# Patient Record
Sex: Female | Born: 1985 | Hispanic: Yes | Smoking: Never smoker
Health system: Southern US, Community
[De-identification: ages and names within clinical notes are randomized; demographics above are authoritative.]

## PROBLEM LIST (undated history)

## (undated) DIAGNOSIS — Z789 Other specified health status: Secondary | ICD-10-CM

## (undated) HISTORY — PX: NO PAST SURGERIES: SHX2092

---

## 2021-09-01 ENCOUNTER — Emergency Department (HOSPITAL_COMMUNITY)
Admission: EM | Admit: 2021-09-01 | Discharge: 2021-09-01 | Disposition: A | Discharge status: Home / Self Care | Payer: Self-pay | Attending: Emergency Medicine | Admitting: Emergency Medicine

## 2021-09-01 ENCOUNTER — Emergency Department (HOSPITAL_COMMUNITY): Payer: Self-pay

## 2021-09-01 ENCOUNTER — Encounter (HOSPITAL_COMMUNITY): Payer: Self-pay | Admitting: Emergency Medicine

## 2021-09-01 DIAGNOSIS — M25572 Pain in left ankle and joints of left foot: Secondary | ICD-10-CM | POA: Insufficient documentation

## 2021-09-01 DIAGNOSIS — X501XXA Overexertion from prolonged static or awkward postures, initial encounter: Secondary | ICD-10-CM | POA: Insufficient documentation

## 2021-09-01 DIAGNOSIS — S93402A Sprain of unspecified ligament of left ankle, initial encounter: Secondary | ICD-10-CM | POA: Insufficient documentation

## 2021-09-01 NOTE — ED Provider Notes (Signed)
[facility] EMERGENCY DEPARTMENT Provider Note   CSN: [id number] Arrival date & time: 09/01/21  0814     History  Chief Complaint  Patient presents with   Ankle Injury    Pain/Swelling    Dawn Little is a 36 y.o. female.  HPI Patient is a 36 year old female with no pertinent past medical history presented emergency room today after she states that she twisted her ankle at approximately 6 PM while wearing high heels.  She denies any head injuries or other injuries from the fall.  States that she mostly just sat down heavily.     Home Medications Prior to Admission medications   Not on File      Allergies    Patient has no known allergies.    Review of Systems   Review of Systems  Physical Exam Updated Vital Signs BP 105/73 (BP Location: Right Arm)    Pulse 79    Temp (!) 97.3 F (36.3 C) (Oral)    Resp 16    LMP 08/02/2021    SpO2 99%  Physical Exam Vitals and nursing note reviewed.  Constitutional:      General: She is not in acute distress.    Appearance: Normal appearance. She is not ill-appearing.  HENT:     Head: Normocephalic and atraumatic.     Mouth/Throat:     Mouth: Mucous membranes are moist.  Eyes:     General: No scleral icterus.       Right eye: No discharge.        Left eye: No discharge.     Conjunctiva/sclera: Conjunctivae normal.  Pulmonary:     Effort: Pulmonary effort is normal.     Breath sounds: No stridor.  Musculoskeletal:     Comments: Left ankle with lateral malleolar tenderness to palpation.  There is scant swelling or no bruising.  Fifth metatarsal without bony tenderness.  Able to wiggle toes.  Cap refill less than 3 seconds in all toes.  DP pulse 3+  Skin:    General: Skin is warm and dry.  Neurological:     Mental Status: She is alert and oriented to person, place, and time. Mental status is at baseline.    ED Results / Procedures / Treatments   Labs (all labs ordered are listed, but  only abnormal results are displayed) Labs Reviewed - No data to display  EKG None  Radiology DG Ankle Complete Left  Result Date: 09/01/2021 CLINICAL DATA:  Trauma to the left ankle. EXAM: LEFT ANKLE COMPLETE - 3+ VIEW COMPARISON:  None. FINDINGS: Apparent bone fragment along the lateral foot may represent cortical avulsion injury. This is not well evaluated on this radiograph. Dedicated radiograph of the foot is recommended for better evaluation. No other acute fracture identified. No dislocation. The bones are well mineralized. No arthritic changes. The soft tissue swelling of the lateral ankle. IMPRESSION: 1. Possible cortical avulsion fracture of the lateral foot. Dedicated radiograph of the foot is recommended for better evaluation. 2. Soft tissue swelling of the lateral ankle. Electronically Signed   By: Elgie Collard M.D.   On: 09/01/2021 03:16   DG Foot Complete Left  Result Date: 09/01/2021 CLINICAL DATA:  Ankle sprain. EXAM: LEFT FOOT - COMPLETE 3+ VIEW COMPARISON:  Ankle radiograph dated 09/01/2021. FINDINGS: There is no evidence of fracture or dislocation. There is no evidence of arthropathy or other focal bone abnormality. Soft tissues are unremarkable. IMPRESSION: Negative. Electronically Signed   By: Burtis Junes  Radparvar M.D.   On: 09/01/2021 03:58    Procedures Procedures    Medications Ordered in ED Medications - No data to display  ED Course/ Medical Decision Making/ A&P                           Medical Decision Making Amount and/or Complexity of Data Reviewed Radiology: ordered.   Patient is a 36 year old Spanish-speaking female Spanish language interpreter was used for the entirety of visit.  Patient chest x-ray ankle approximately 6 PM this evening does have lateral malleolus tenderness and swelling.  X-ray obtained was negative for fracture of the malleolus side however there is concern for foot fracture on x-ray.  X-ray obtained which is negative.  I  independently reviewed all x-rays.  Patient placed in ankle brace and recommended Tylenol ibuprofen and orthopedic follow-up.  She is distally neurovascularly intact.  No other injuries   Physical exam reassuring  Instructions provided to patient in Spanish.  She understands recommendations and follow-up instructions.  Final Clinical Impression(s) / ED Diagnoses Final diagnoses:  Sprain of left ankle, unspecified ligament, initial encounter    Rx / DC Orders ED Discharge Orders     None         Gailen Shelter, [place] 09/01/21 0422    Dione Booze, MD 09/01/21 [phone]

## 2021-09-01 NOTE — ED Triage Notes (Signed)
Patient twisted her left ankle while wearing high heels shoes last night , presents with left ankle pain/swelling .

## 2021-09-01 NOTE — Discharge Instructions (Addendum)
Your x-ray does not show any evidence of a fracture.  Please wear the ankle brace provided to you.  Use crutches as needed.  Ice and elevate your ankle.  I have given you the information for an orthopedic office to follow-up with.  Please read the attached information about ankle sprains they may take weeks to heal.  Tylenol and ibuprofen can be very helpful please take it as directed below.  Please use Tylenol or ibuprofen for pain.  You may use 600 mg ibuprofen every 6 hours or 1000 mg of Tylenol every 6 hours.  You may choose to alternate between the 2.  This would be most effective.  Not to exceed 4 g of Tylenol within 24 hours.  Not to exceed 3200 mg ibuprofen 24 hours.       Su radiografa no muestra ninguna evidencia de fractura. Por favor, use la tobillera que se le proporcion. Use muletas segn sea necesario. Ponga hielo y eleve su tobillo. Le he dado la informacin de un consultorio ortopdico para Insurance risk surveyor. Lea la informacin adjunta sobre los esguinces de tobillo que pueden tardar Scientific laboratory technician. Tylenol e ibuprofeno pueden ser muy tiles, tmelos como se indica a continuacin.  Utilice Tylenol o ibuprofeno para el dolor. Puede usar 600 mg de ibuprofeno cada 6 horas o 1000 mg de Tylenol cada 6 horas. Puede optar por [employer] 2. Esto sera ms efectivo. No exceder los 4 g de Tylenol en 24 horas. No exceder los 3200 mg de ibuprofeno 24 horas.

## 2021-10-19 ENCOUNTER — Ambulatory Visit (INDEPENDENT_AMBULATORY_CARE_PROVIDER_SITE_OTHER): Payer: Self-pay | Admitting: Obstetrics and Gynecology

## 2021-10-19 ENCOUNTER — Other Ambulatory Visit: Payer: Self-pay

## 2021-10-19 VITALS — BP 112/71 | HR 74 | Wt 153.0 lb

## 2021-10-19 DIAGNOSIS — Z3200 Encounter for pregnancy test, result unknown: Secondary | ICD-10-CM

## 2021-10-19 DIAGNOSIS — Z3491 Encounter for supervision of normal pregnancy, unspecified, first trimester: Secondary | ICD-10-CM

## 2021-10-19 DIAGNOSIS — O219 Vomiting of pregnancy, unspecified: Secondary | ICD-10-CM

## 2021-10-19 LAB — POCT PREGNANCY, URINE: Preg Test, Ur: POSITIVE — AB

## 2021-10-19 MED ORDER — PROMETHAZINE HCL 25 MG PO TABS: 25.0000 mg | ORAL_TABLET | Freq: Four times a day (QID) | ORAL | 0 refills | Status: DC | PRN

## 2021-10-19 NOTE — Progress Notes (Signed)
Possible Pregnancy ? ?Here today for pregnancy confirmation. UPT in office today is positive. Pt reports first positive home UPT on 10/12/21. Began feeling symptoms of pregnancy around 09/21/21. Reviewed dating with patient:  ? ?LMP: 09/03/21 ?EDD: 06/10/22 ?6w 4d today ? ?Patient reports a regular period every month that usually lasts 4-6 days. Patient states LMP was different that usual period. Describes as lighter and only lasting 2 days. OB history reviewed; no prior pregnancies. Reviewed medications and allergies with patient; list of medications safe to take during pregnancy given. Recommended pt begin prenatal vitamin. ? ?Patient not seen in our office prior. Vergie Living, MD to bedside for brief visit. [place] recommends dating [place] to confirm dates due to abnormal last period. Patient reports headaches and nausea. Verbal order given for Phenergan. Pt will follow up to schedule new pt appts after [place] on 11/02/21. ? ?Entire encounter completed with [facility] interpreter Raquel. ? ?Marjo Bicker, RN ?10/19/2021  9:19 AM ? ?

## 2021-10-19 NOTE — Patient Instructions (Addendum)
Pick up over the counter Extra Strength Tylenol and prenatal vitamin ?Pick up prescription for Phenergan ? ? ?Safe Medications in Pregnancy  ? ?Acne:  ?Benzoyl Peroxide  ?Salicylic Acid  ? ?Backache/Headache:  ?Tylenol: 2 regular strength every 4 hours OR  ?             2 Extra strength every 6 hours  ? ?Colds/Coughs/Allergies:  ?Benadryl (alcohol free) 25 mg every 6 hours as needed  ?Breath right strips  ?Claritin  ?Cepacol throat lozenges  ?Chloraseptic throat spray  ?Cold-Eeze- up to three times per day  ?Cough drops, alcohol free  ?Flonase (by prescription only)  ?Guaifenesin  ?Mucinex  ?Robitussin DM (plain only, alcohol free)  ?Saline nasal spray/drops  ?Sudafed (pseudoephedrine) & Actifed * use only after [redacted] weeks gestation and if you do not have high blood pressure  ?Tylenol  ?Vicks Vaporub  ?Zinc lozenges  ?Zyrtec  ? ?Constipation:  ?Colace  ?Ducolax suppositories  ?Fleet enema  ?Glycerin suppositories  ?Metamucil  ?Milk of magnesia  ?Miralax  ?Senokot  ?Smooth move tea  ? ?Diarrhea:  ?Kaopectate  ?Imodium A-D  ? ?*NO pepto Bismol  ? ?Hemorrhoids:  ?Anusol  ?Anusol HC  ?Preparation H  ?Tucks  ? ?Indigestion:  ?Tums  ?Maalox  ?Mylanta  ?Zantac  ?Pepcid  ? ?Insomnia:  ?Benadryl (alcohol free) 25mg  every 6 hours as needed  ?Tylenol PM  ?Unisom, no Gelcaps  ? ?Leg Cramps:  ?Tums  ?MagGel  ? ?Nausea/Vomiting:  ?Bonine  ?Dramamine  ?Emetrol  ?Ginger extract  ?Sea bands  ?Meclizine  ?Nausea medication to take during pregnancy:  ?Unisom (doxylamine succinate 25 mg tablets) Take one tablet daily at bedtime. If symptoms are not adequately controlled, the dose can be increased to a maximum recommended dose of two tablets daily (1/2 tablet in the morning, 1/2 tablet mid-afternoon and one at bedtime).  ?Vitamin B6 100mg  tablets. Take one tablet twice a day (up to 200 mg per day).  ? ?Skin Rashes:  ?Aveeno products  ?Benadryl cream or 25mg  every 6 hours as needed  ?Calamine Lotion  ?1% cortisone cream  ? ?Yeast  infection:  ?Gyne-lotrimin 7  ?Monistat 7  ? ? ?**If taking multiple medications, please check labels to avoid duplicating the same active ingredients  ?**take medication as directed on the label  ?** Do not exceed 4000 mg of tylenol in 24 hours  ?**Do not take medications that contain aspirin or ibuprofen  ?    ?   ? ? ?

## 2021-11-02 ENCOUNTER — Other Ambulatory Visit: Payer: Self-pay | Admitting: Obstetrics and Gynecology

## 2021-11-02 ENCOUNTER — Other Ambulatory Visit: Payer: Self-pay

## 2021-11-02 ENCOUNTER — Ambulatory Visit
Admission: RE | Admit: 2021-11-02 | Discharge: 2021-11-02 | Disposition: A | Discharge status: Home / Self Care | Payer: Self-pay | Source: Ambulatory Visit | Attending: Obstetrics and Gynecology | Admitting: Obstetrics and Gynecology

## 2021-11-02 DIAGNOSIS — Z3689 Encounter for other specified antenatal screening: Secondary | ICD-10-CM | POA: Insufficient documentation

## 2021-11-02 DIAGNOSIS — Z3491 Encounter for supervision of normal pregnancy, unspecified, first trimester: Secondary | ICD-10-CM | POA: Insufficient documentation

## 2021-11-02 DIAGNOSIS — Z3A10 10 weeks gestation of pregnancy: Secondary | ICD-10-CM | POA: Insufficient documentation

## 2021-11-02 DIAGNOSIS — O208 Other hemorrhage in early pregnancy: Secondary | ICD-10-CM | POA: Insufficient documentation

## 2021-11-03 ENCOUNTER — Telehealth: Payer: Self-pay | Admitting: Medical

## 2021-11-03 ENCOUNTER — Encounter: Payer: Self-pay | Admitting: Family Medicine

## 2021-11-03 NOTE — Telephone Encounter (Signed)
Attempted to contact patient with [place] results from 11/02/21. Only contact number listed gives message that call cannot be completed at this time and there is no option to leave a message. Well send letter to call the office.  ? ?Vonzella Nipple, PA-C ?11/03/2021 2:05 PM  ? ?

## 2021-11-09 ENCOUNTER — Telehealth: Payer: Self-pay

## 2021-11-09 NOTE — Telephone Encounter (Signed)
Patient returning call from [place], [place] for [place] results. Raynelle Fanning, PA not in office today. Reviewed results with Para March, MD who finds single living IUP at 10w 1d. FHR of 182 is normal for [place]. Small subchorionic hemorrhage visualized; no increased risk for miscarriage. Pt may establish prenatal care. Results and provider recommendation reviewed with patient with [facility] [place] interpreter Raquel. Call transferred to front office to schedule appts. ?

## 2021-11-23 ENCOUNTER — Telehealth: Payer: Self-pay

## 2021-11-23 ENCOUNTER — Telehealth (INDEPENDENT_AMBULATORY_CARE_PROVIDER_SITE_OTHER): Payer: Self-pay

## 2021-11-23 DIAGNOSIS — Z3480 Encounter for supervision of other normal pregnancy, unspecified trimester: Secondary | ICD-10-CM | POA: Insufficient documentation

## 2021-11-23 DIAGNOSIS — Z3A00 Weeks of gestation of pregnancy not specified: Secondary | ICD-10-CM

## 2021-11-23 NOTE — Progress Notes (Signed)
New OB Intake ? ?I connected with  Dawn Little on 11/23/21 at  2:15 PM EDT by telephone  Visit and verified that I am speaking with the correct person using two identifiers. Nurse is located at [facility] and pt is located at [facility]. ? ?I discussed the limitations, risks, security and privacy concerns of performing an evaluation and management service by telephone and the availability of in person appointments. I also discussed with the patient that there may be a patient responsible charge related to this service. The patient expressed understanding and agreed to proceed. ? ?I explained I am completing New OB Intake today. We discussed her EDD of 05/30/22 that is based on U/S on 11/02/21. Pt is G1/P0. I reviewed her allergies, medications, Medical/Surgical/OB history, and appropriate screenings. I informed her of [facility] services. Based on history, this is a/an  pregnancy uncomplicated .  ? ?There are no problems to display for this patient. ? ? ?Concerns addressed today ? ?Delivery Plans:  ?Plans to deliver at [facility].  ? ?MyChart/Babyscripts ?MyChart access verified. I explained pt will have some visits in office and some virtually. Babyscripts instructions given and order placed. Patient verifies receipt of registration text/e-mail. Account successfully created and app downloaded. ? ?Blood Pressure Cuff  ?Patient is self-pay; explained patient will be given BP cuff at first prenatal appt. Explained after first prenatal appt pt will check weekly and document in 85. ? ?Weight scale: Patient does / does not  have weight scale. Weight scale ordered for patient to pick up from [employer].  ? ?Anatomy US ?Explained first scheduled [place] will be around 19 weeks. Anatomy US scheduled for 01/04/22 at 0100. Pt notified to arrive at 1245. ?Scheduled AFP lab only appointment if CenteringPregnancy pt for same day as anatomy US.  ? ?Labs ?Discussed Johnsie Cancel genetic screening with patient. Would like both  Panorama and Horizon drawn at new OB visit.Also if interested in genetic testing, tell patient she will need AFP 15-21 weeks to complete genetic testing .Routine prenatal labs needed. ? ?Covid Vaccine ?Patient has covid vaccine.  ? ?Is patient a CenteringPregnancy candidate? Not a candidate due to Spanish Pt.   "Centering Patient" indicated on sticky note ?  ?Is patient a Mom+Baby Combined Care candidate? Not a candidate  Due date 05/30/22 Scheduled with Mom+Baby provider  ?  ?Is patient interested in [place]? No  "Interested in [employer] - Schedule next visit with CNM" on sticky note ? ?Informed patient of [facility] website  and placed link in her AVS.  ? ?Social Determinants of Health ?Food Insecurity: Patient denies food insecurity. ?WIC Referral: Patient is interested in referral to [facility].  ?Transportation: Patient denies transportation needs. ?Childcare: Discussed no children allowed at ultrasound appointments. Offered childcare services; patient declines childcare services at this time. ? ?Send link to Pregnancy Navigators ? ? ?Placed OB Box on problem list and updated ? ?First visit review ?I reviewed new OB appt with pt. I explained she will have a pelvic exam, ob bloodwork with genetic screening, and PAP smear. Explained pt will be seen by Vilma Meckel, MD  at first visit; encounter routed to appropriate provider. Explained that patient will be seen by pregnancy navigator following visit with provider. [facility] information placed in AVS.  ? ?Bethanne Ginger, CMA ?11/23/2021  2:36 PM  ?

## 2021-11-23 NOTE — Patient Instructions (Signed)
AREA PEDIATRIC/FAMILY PRACTICE PHYSICIANS ? ?[place] ([address]) ?[facility] ?Chambliss, MD; Eniola, MD; Hale, MD; Hensel, MD; McDiarmid, MD; McIntyer, MD; Brysen Shankman, MD; Walden, MD ?[address] [place], [place] [address] ?[phone] ?Mon-Fri 8:30-12:30, 1:30-5:00 ?Providers come to see babies at [facility] ?Accepting Medicaid ?[facility] ?Limited providers who accept newborns: Koirala, MD; Morrow, MD; Wolters, MD ?[address], [place], [place] [address] ?[phone] ?Mon-Fri 8:00-5:30 ?Babies seen by providers at [facility] ?Does NOT accept Medicaid ?Please call early in hospitalization for appointment (limited availability)  ?[facility] ?Mulberry, MD ?[address] [place], [place] [address] ?[phone] ?Mon, Tue, Thur, Fri 8:30-5:00, Wed 10:00-7:00 (closed 1-2pm) ?Babies seen by [facility] providers ?Accepting Medicaid ?[facility] ?Rubin, MD ?[address], [place], [place] [address] ?[phone] ?Mon-Fri 8:30-5:00, Sat 8:30-12:00 ?Provider comes to see babies at [facility] ?Accepting Medicaid ?Must have been referred from current patients or contacted office prior to delivery ?[facility] ([facility]) ?Brown, MD; Chandler, MD; Ettefagh, MD; Grant, MD; Lester, MD; McCormick, MD; McQueen, MD; Prose, MD; Simha, MD; Stanley, MD; Stryffeler, NP; Tebben, NP ?[address], [place], [place] [address] ?[phone] ?Mon, Tue, Thur, Fri 8:30-5:30, Wed 9:30-5:30, Sat 8:30-12:30 ?Babies seen by [facility] providers ?Accepting Medicaid ?Only accepting infants of first-time parents or siblings of current patients ?Hospital discharge coordinator will make follow-up appointment ?Jack Amos ?[address], [place], [place]  [address] ?[phone]   Fax - [phone] ?[facility] ?[address], [place], [place]  [address] ?Phone - [phone]   Fax - [phone] ?Shilpa Gosrani ?[address], [place], [place]  [address] ?[phone] ? ?[place] ([address]) ?[facility] ?Bates, MD; [place]field, MD; Cooper, Cox, MD; MD; Davis, MD; Dovico, MD; Ettefaugh, MD; Little, MD; Lowe, MD; Keiffer, MD; [place], MD; Sumner, MD; Williams, MD ?[address], [place], [place] [address] ?[phone] ?Mon-Fri 8:30-5:00 (extended evenings Mon-Thur as needed), Sat-Sun 10:00-1:00 ?Providers come to see babies at [facility] ?Accepting Medicaid for families of first-time babies and families with all children in the household age 3 and under. Must register with office prior to making appointment (M-F only). ?[facility] ?Henson, NP; Knapp, MD; Lalonde, MD; Tysinger, PA ?[address] [place], [place] [address] ?[phone] ?Mon-Fri 8:00-5:00 ?Babies seen by providers at [facility] ?Does NOT accept Medicaid/Commercial Insurance Only ?[facility] ([facility])  ?Artis, MD; Barnes, MD; Bratton, MD; Coccaro, MD; Lockett Gardner, MD; Kramer, MD; Marshall, MD; Netherton, MD; Poleto, MD; Skinner, MD ?[address] [place], [place] [address] ?[phone] ?Mon-Fri 8:30-5:30, Sat (Oct.-Mar.) 9:00-1:00 ?Babies seen by providers at [facility] ?Accepting Medicaid ? ?[place] ([address]) ?[facility] ?Reid, MD; Warner, MD ?[address], [place], [place] [address] ?[phone] ?Mon-Fri 8:30-5:00, Sat 8:30-12:00 ?Providers come to see babies at [facility] ?Does NOT accept Medicaid ?[facility] ?Becker, PA; Hagler, MD; Scifres, PA; Sun, MD; Swayne, MD ?[address], [place],  [address] ?[phone] ?Mon-Fri 8:00-5:00 ?Babies seen by providers at [facility] ?Does NOT accept Medicaid ?Only accepting babies of parents who  are patients ?Please call early in hospitalization for appointment (limited availability) ?[facility] ?Clark, MD; Frye, MD; Kelleher, MD; Mack, NP; Miller, MD; O'Keller, MD; Patterson, NP; Pudlo, MD; Puzio, MD; Thomas, MD; Tucker, MD; Twiselton, MD ?[address], [place], [place] [address] ?[phone] ?Mon-Fri 8:00-5:00, Sat 9:00-12:00 ?Providers come to see babies at [facility] ?Does NOT accept Medicaid ? ?[place] ([address]) ?[facility] ?Limited providers accepting new patients: Brake, NP; Wharton, PA ?[address], [place], [place] [address] ?[phone] ?Mon-Fri 8:00-5:00 ?Babies seen by providers at [facility] ?Does NOT accept Medicaid ?Only accepting babies of parents who are patients ?Please call early in hospitalization for appointment (limited availability) ?[facility] ?Gay, MD; Quinlan, MD ?[address] [place], [place] [address] ?[phone] (press [phone] to schedule appointment) ?Mon-Fri 8:00-5:00 ?Providers come to see babies at [facility] ?Does NOT accept Medicaid ?[facility] ?Mazer, MD ?[address] [place], [place] [address] ?[phone] ?Mon-Fri 8:30-5:00 (lunch 12:30-1:00), extended hours by appointment only Wed 5:00-6:30 ?Babies seen by [facility] providers ?Accepting Medicaid ?[facility] ?Banks, MD; Jordan, MD; Koberlein, MD ?[address], [place], [place] [address] ?[phone] ?Mon-Fri 8:00-5:00 ?Babies seen by [facility] providers ?Does NOT accept Medicaid ?[facility] ?Parker, MD; Hunter, MD; [place], DO ?[address] [place], [place] [address] ?[phone] ?Mon-Fri 8:00-5:00 ?Babies seen by [facility] providers ?Does NOT accept Medicaid ?[facility] ?Brandon, PA; Brecken, PA; Christy, NP; Dees, MD; DeClaire, MD; DeWeese, MD; Hansen, NP; Mills, NP; Parrish, NP; Smoot, NP; Summer, MD; Vapne,  MD ?[address] [place], [place] [address] ?[phone] ?Mon-Fri 8:30-5:00, Sat 10:00-1:00 ?Providers come to see babies at [facility] ?Does NOT accept Medicaid ?Free prenatal information session Tuesdays at 4:45pm ?[facility] ?Bouska, MD; Gordon, PA; Jeffery, PA; Weber, PA ?[address] [place] [address] ?[phone] ?Mon-Fri 7:30-5:30 ?Babies seen by [facility] providers ?[facility] ?[address], [place], [place]  [address] ?[phone]   Fax - [phone] ? ?[place] ([address]) ?[facility] ?Reese, MD ?[address] [place], [place] [address] ?[phone] ?Mon-Thur 8:00-6:00 ?Providers come to see babies at [facility] ?Accepting Medicaid ?[facility] ?Anderson, NP; Badger, MD; Beal, PA; Spencer, PA ?[address] [place], [place] [address] ?[phone] ?Mon-Thur 7:30-7:30, Fri 7:30-4:30 ?Babies seen by [facility] providers ?Accepting Medicaid ?[facility] ?Agbuya, MD; Klett, NP; Romgoolam, MD ?[address],  [place], [place] [address] ?[phone] ?Mon-Fri 8:30-5:00, Sat 8:30-12:00 ?Providers come to see babies at [facility] ?Accepting Medicaid ?Must have ?Meet & Greet? appointment at office prior to delivery ?[facility] ([facility]) ?McCord, MD; [place], MD; Wood, MD ?[address], [place], [place] [address] ?[phone] ?Mon-Wed 8:00-6:00, Thur-Fri 8:00-5:00, Sat 9:00-12:00 ?Providers come to see babies at [facility] ?Does NOT accept Medicaid ?Only accepting siblings of current patients ?[facility]  ?[address], [place], [place]  [address] ?[phone]   Fax - [phone] ?[facility] ?[address], [place], [place]  [address] ?[phone]   Fax -  [phone] ? ?[place] ([address]) ?[facility] ?Cirigliano, DO; Matthews, DO ?[address] ,  [address] ?[phone] ?Mon-Fri 7:00-5:00 ?Babies seen by [place]

## 2021-12-06 ENCOUNTER — Encounter: Payer: Self-pay | Admitting: Obstetrics and Gynecology

## 2021-12-16 ENCOUNTER — Ambulatory Visit (INDEPENDENT_AMBULATORY_CARE_PROVIDER_SITE_OTHER): Payer: Self-pay | Admitting: Family Medicine

## 2021-12-16 ENCOUNTER — Encounter: Payer: Self-pay | Admitting: Family Medicine

## 2021-12-16 ENCOUNTER — Other Ambulatory Visit (HOSPITAL_COMMUNITY)
Admission: RE | Admit: 2021-12-16 | Discharge: 2021-12-16 | Disposition: A | Discharge status: Home / Self Care | Payer: Self-pay | Source: Ambulatory Visit

## 2021-12-16 VITALS — BP 110/57 | HR 77 | Wt 168.0 lb

## 2021-12-16 DIAGNOSIS — Z3A16 16 weeks gestation of pregnancy: Secondary | ICD-10-CM | POA: Insufficient documentation

## 2021-12-16 DIAGNOSIS — Z3480 Encounter for supervision of other normal pregnancy, unspecified trimester: Secondary | ICD-10-CM

## 2021-12-16 DIAGNOSIS — G479 Sleep disorder, unspecified: Secondary | ICD-10-CM

## 2021-12-16 MED ORDER — DOXYLAMINE SUCCINATE (SLEEP) 25 MG PO TABS: 25.0000 mg | ORAL_TABLET | Freq: Every evening | ORAL | 0 refills | Status: DC | PRN

## 2021-12-16 NOTE — Progress Notes (Signed)
?  ? ?Subjective:  ? ?Dawn Little is a 36 y.o. G1P0000 at [redacted]w[redacted]d by 10 week [place] being seen today for her first obstetrical visit.  She has not had any significant inssues in her pregnancy thus far.  She reports no chronic medical issues.  She is not taking any medications other than her prenatal vitamin.  She denies vaginal bleeding, vaginal discharge, pelvic pain, dysuria, or nausea/vomiting. She has some trouble sleeping and would like to discuss ways to help with this today. She has no other concerns at this time.  ? ?HISTORY: ?OB History  ?Gravida Para Term Preterm AB Living  ?1 0 0 0 0 0  ?SAB IAB Ectopic Multiple Live Births  ?0 0 0 0 0  ?  ?# Outcome Date GA Lbr Len/2nd Weight Sex Delivery Anes PTL Lv  ?1 Current           ?  ?Last pap smear reportedly done in 2020 per patient and normal. Due for repeat today. Does not have insurance coverage currently. Referral placed to [facility] program. ? ?History reviewed. No pertinent past medical history. ? ?History reviewed. No pertinent surgical history. ? ?History reviewed. No pertinent family history. ? ?Social History  ? ?Tobacco Use  ? Smoking status: Never  ?  Passive exposure: Never  ? Smokeless tobacco: Never  ?Vaping Use  ? Vaping Use: Never used  ?Substance Use Topics  ? Alcohol use: Not Currently  ?  Comment: Beer & Wine Occasionally  ? Drug use: Never  ? ?No Known Allergies ? ?Current Outpatient Medications on File Prior to Visit  ?Medication Sig Dispense Refill  ? Prenatal Vit-Fe Fumarate-FA (MULTIVITAMIN-PRENATAL) 27-0.8 MG TABS tablet Take 1 tablet by mouth daily at 12 noon.    ? ?No current facility-administered medications on file prior to visit.  ? ?Exam  ? ?Vitals:  ? 12/16/21 0856  ?BP: (!) 110/57  ?Pulse: 77  ?Weight: 168 lb (76.2 kg)  ? ?Fetal Heart Rate (bpm): 145 ? ?System: General: Well-developed, well-nourished female in no acute distress  ? Skin: Warm and dry  ? Neurologic: Alert and oriented x3, normal mood and affect  ?  Extremities: Normal ROM, no LE edema   ? HEENT EOMI, conjunctivae clear, MMM  ? Cardiovascular: Normal rate, warm and well perfused   ? Respiratory:  Normal work of breathing on room air   ? Abdomen: Soft, non-tender, gravid  ? ?  ?Assessment:  ? ?Pregnancy: G1P0000 ?Patient Active Problem List  ? Diagnosis Date Noted  ? Supervision of other normal pregnancy, antepartum 11/23/2021  ? ?Plan:  ? ?1. Supervision of other normal pregnancy, antepartum ?2. [redacted] weeks gestation of pregnancy ?Progressing well. FHT within normal limits and VSS. Initial labs obtained today as below including genetic screening. Referred to [facility] for pap smear. [place] scheduled. Will follow up in 4 weeks for next OB visit.  ?- AFP, Serum, Open Spina Bifida ?- CBC/D/Plt+RPR+Rh+ABO+RubIgG... ?- Hemoglobin A1c ?- Cervicovaginal ancillary only( [place]) ?- Culture, OB Urine ?- Genetic Screening ? ?3. Trouble in sleeping ?Will trial Unisom nightly and reassess at next visit.  ?- doxylamine, Sleep, (UNISOM) 25 MG tablet; Take 1 tablet (25 mg total) by mouth at bedtime as needed.  Dispense: 30 tablet; Refill: 0 ? ?Initial labs drawn. ?Continue prenatal vitamins. ?Genetic Screening discussed: NIPS ordered. ?Ultrasound discussed; fetal anatomic survey: Scheduled. ?Problem list reviewed and updated. ?The nature of [facility] [place] [facility] with multiple MDs and other Advanced Practice  Providers was explained to patient; also emphasized that residents, students are part of our team. ?Routine obstetric precautions reviewed. ? ?Return in about 4 weeks (around 01/13/2022) for follow up OB visit. ? ?Evalina Field, MD ? ? ?

## 2021-12-17 LAB — CERVICOVAGINAL ANCILLARY ONLY
Chlamydia: NEGATIVE
Comment: NEGATIVE
Comment: NORMAL
Neisseria Gonorrhea: NEGATIVE

## 2021-12-18 LAB — AFP, SERUM, OPEN SPINA BIFIDA
AFP MoM: 1.29
AFP Value: 43.1 ng/mL
Gest. Age on Collection Date: 16.3 weeks
Maternal Age At EDD: 35 yr
OSBR Risk 1 IN: 4947
Test Results:: NEGATIVE
Weight: 168 [lb_av]

## 2021-12-18 LAB — CBC/D/PLT+RPR+RH+ABO+RUBIGG...
Antibody Screen: NEGATIVE
Basophils Absolute: 0 10*3/uL (ref 0.0–0.2)
Basos: 1 %
EOS (ABSOLUTE): 0.1 10*3/uL (ref 0.0–0.4)
Eos: 1 %
HCV Ab: NONREACTIVE
HIV Screen 4th Generation wRfx: NONREACTIVE
Hematocrit: 34.4 % (ref 34.0–46.6)
Hemoglobin: 11.3 g/dL (ref 11.1–15.9)
Hepatitis B Surface Ag: NEGATIVE
Immature Grans (Abs): 0.2 10*3/uL — ABNORMAL HIGH (ref 0.0–0.1)
Immature Granulocytes: 2 %
Lymphocytes Absolute: 1.8 10*3/uL (ref 0.7–3.1)
Lymphs: 21 %
MCH: 30.5 pg (ref 26.6–33.0)
MCHC: 32.8 g/dL (ref 31.5–35.7)
MCV: 93 fL (ref 79–97)
Monocytes Absolute: 0.8 10*3/uL (ref 0.1–0.9)
Monocytes: 9 %
Neutrophils Absolute: 5.5 10*3/uL (ref 1.4–7.0)
Neutrophils: 66 %
Platelets: 278 10*3/uL (ref 150–450)
RBC: 3.71 x10E6/uL — ABNORMAL LOW (ref 3.77–5.28)
RDW: 13.6 % (ref 11.7–15.4)
RPR Ser Ql: NONREACTIVE
Rh Factor: POSITIVE
Rubella Antibodies, IGG: 10.2 index (ref >0.99)
WBC: 8.3 10*3/uL (ref 3.4–10.8)

## 2021-12-18 LAB — URINE CULTURE, OB REFLEX: Organism ID, Bacteria: NO GROWTH

## 2021-12-18 LAB — HCV INTERPRETATION

## 2021-12-18 LAB — CULTURE, OB URINE

## 2021-12-18 LAB — HEMOGLOBIN A1C
Est. average glucose Bld gHb Est-mCnc: 100 mg/dL
Hgb A1c MFr Bld: 5.1 % (ref 4.8–5.6)

## 2022-01-04 ENCOUNTER — Ambulatory Visit: Payer: Self-pay | Attending: Family Medicine

## 2022-01-04 ENCOUNTER — Ambulatory Visit: Payer: Self-pay | Admitting: *Deleted

## 2022-01-04 VITALS — BP 106/64 | HR 91 | Ht 60.63 in

## 2022-01-04 DIAGNOSIS — Z3A19 19 weeks gestation of pregnancy: Secondary | ICD-10-CM | POA: Insufficient documentation

## 2022-01-04 DIAGNOSIS — Z363 Encounter for antenatal screening for malformations: Secondary | ICD-10-CM | POA: Insufficient documentation

## 2022-01-04 DIAGNOSIS — Z3480 Encounter for supervision of other normal pregnancy, unspecified trimester: Secondary | ICD-10-CM | POA: Insufficient documentation

## 2022-01-04 DIAGNOSIS — O09522 Supervision of elderly multigravida, second trimester: Secondary | ICD-10-CM | POA: Insufficient documentation

## 2022-01-05 ENCOUNTER — Other Ambulatory Visit: Payer: Self-pay | Admitting: *Deleted

## 2022-01-05 DIAGNOSIS — Z362 Encounter for other antenatal screening follow-up: Secondary | ICD-10-CM

## 2022-01-05 DIAGNOSIS — O09522 Supervision of elderly multigravida, second trimester: Secondary | ICD-10-CM

## 2022-01-13 ENCOUNTER — Ambulatory Visit (INDEPENDENT_AMBULATORY_CARE_PROVIDER_SITE_OTHER): Payer: Self-pay | Admitting: Student

## 2022-01-13 VITALS — BP 121/76 | HR 101 | Wt 178.0 lb

## 2022-01-13 DIAGNOSIS — Z3A20 20 weeks gestation of pregnancy: Secondary | ICD-10-CM

## 2022-01-13 DIAGNOSIS — Z3480 Encounter for supervision of other normal pregnancy, unspecified trimester: Secondary | ICD-10-CM

## 2022-01-13 NOTE — Progress Notes (Signed)
   PRENATAL VISIT NOTE  Subjective:  Dawn Little is a 36 y.o. G1P0000 at [redacted]w[redacted]d being seen today for ongoing prenatal care.  She is currently monitored for the following issues for this low-risk pregnancy and has Supervision of other normal pregnancy, antepartum on their problem list.  Patient reports no complaints.  Contractions: Not present. Vag. Bleeding: None.  Movement: Present. Denies leaking of fluid.   The following portions of the patient's history were reviewed and updated as appropriate: allergies, current medications, past family history, past medical history, past social history, past surgical history and problem list.   Objective:   Vitals:   01/13/22 1606  BP: 121/76  Pulse: (!) 101  Weight: 178 lb (80.7 kg)    Fetal Status: Fetal Heart Rate (bpm): 146 Fundal Height: 20 cm Movement: Present     General:  Alert, oriented and cooperative. Patient is in no acute distress.  Skin: Skin is warm and dry. No rash noted.   Cardiovascular: Normal heart rate noted  Respiratory: Normal respiratory effort, no problems with respiration noted  Abdomen: Soft, gravid, appropriate for gestational age.  Pain/Pressure: Absent     Pelvic: Cervical exam deferred        Extremities: Normal range of motion.  Edema: None  Mental Status: Normal mood and affect. Normal behavior. Normal judgment and thought content.   Assessment and Plan:  Pregnancy: G1P0000 at [redacted]w[redacted]d 1. Supervision of other normal pregnancy, antepartum   -doing well, no complaints -clarified that she has a child at homebut it is not her biological child; patient's MFM report states she has had an NSVD but she is a primagravida   Preterm labor symptoms and general obstetric precautions including but not limited to vaginal bleeding, contractions, leaking of fluid and fetal movement were reviewed in detail with the patient. Please refer to After Visit Summary for other counseling recommendations.   Return in  about 4 weeks (around 02/10/2022), or LROB with KK if possible in 3-5 weeks.  Future Appointments  Date Time Provider Department Center  03/29/2022  8:30 AM [facility] NURSE [facility]  03/29/2022  8:45 AM [facility] US4 [facility]    Marylene Land, CNM

## 2022-02-10 ENCOUNTER — Ambulatory Visit (INDEPENDENT_AMBULATORY_CARE_PROVIDER_SITE_OTHER): Payer: Self-pay | Admitting: Student

## 2022-02-10 ENCOUNTER — Other Ambulatory Visit: Payer: Self-pay

## 2022-02-10 VITALS — BP 108/68 | HR 107 | Wt 188.0 lb

## 2022-02-10 DIAGNOSIS — Z3480 Encounter for supervision of other normal pregnancy, unspecified trimester: Secondary | ICD-10-CM

## 2022-02-10 DIAGNOSIS — Z3A24 24 weeks gestation of pregnancy: Secondary | ICD-10-CM

## 2022-02-10 NOTE — Patient Instructions (Signed)
CenteringPregnancy es un modelo de atencin que comenz hace 30 aos y se [place] en alrededor de 600 prcticas en los [place]. Te encuentras con un grupo de 8-12 mujeres que vencen a la [employer] t. En Centering tendr tiempo individual con el proveedor y se Games developer. Hay mucho ms tiempo para la discusin y Freight forwarder. De hecho, tendr mucho ms tiempo con su proveedor en Centering que en la atencin prenatal tradicional.? Llegar directamente a la sala de centrado y no esperar en el vestbulo para que no pierda Museum/gallery conservator. Tendr visitas de 2 horas cada 4 semanas y luego cada 2 semanas. Sabrs tus citas prenatales de Centering con anticipacin. En su ltimo mes de embarazo, tambin puede venir para algunas visitas individuales. Se pueden programar citas adicionales si necesita ms atencin. Los estudios han demostrado que CenteringPregnancy mejora los [place]. Hemos visto mejoras especialmente grandes en menos mujeres negras que dan a luz a bebs demasiado pequeos o prematuros. Visite el sitio web [email or web address].org para obtener ms informacin.   Fechas:  13 de Julio [address] julio 10 de Agosto 24 de agosto 7 de septiembre 21 de septiembe

## 2022-02-10 NOTE — Progress Notes (Signed)
   PRENATAL VISIT NOTE  Subjective:  Dawn Little is a 36 y.o. G1P0000 at [redacted]w[redacted]d being seen today for ongoing prenatal care.  She is currently monitored for the following issues for this low-risk pregnancy and has Supervision of other normal pregnancy, antepartum on their problem list.  Patient reports no complaints.  Contractions: Not present. Vag. Bleeding: None.  Movement: Present. Denies leaking of fluid.   The following portions of the patient's history were reviewed and updated as appropriate: allergies, current medications, past family history, past medical history, past social history, past surgical history and problem list.   Objective:   Vitals:   02/10/22 1342  BP: 108/68  Pulse: (!) 107  Weight: 188 lb (85.3 kg)    Fetal Status: Fetal Heart Rate (bpm): 149 Fundal Height: 25 cm Movement: Present     General:  Alert, oriented and cooperative. Patient is in no acute distress.  Skin: Skin is warm and dry. No rash noted.   Cardiovascular: Normal heart rate noted  Respiratory: Normal respiratory effort, no problems with respiration noted  Abdomen: Soft, gravid, appropriate for gestational age.  Pain/Pressure: Absent     Pelvic: Cervical exam deferred        Extremities: Normal range of motion.  Edema: Mild pitting, slight indentation  Mental Status: Normal mood and affect. Normal behavior. Normal judgment and thought content.   Assessment and Plan:   1. Supervision of other normal pregnancy, antepartum   2. [redacted] weeks gestation of pregnancy    -patient is interested in centering; she will talk with her employer and see if she can be off every other THursday Preterm labor symptoms and general obstetric precautions including but not limited to vaginal bleeding, contractions, leaking of fluid and fetal movement were reviewed in detail with the patient. Please refer to After Visit Summary for other counseling recommendations.   Return in about 4 weeks (around  03/10/2022), or 2 hour gtt and provider visit.  Future Appointments  Date Time Provider Department Center  03/11/2022  8:20 AM [facility]  03/11/2022  9:15 AM [facility]  03/29/2022  8:30 AM [facility] NURSE [facility]  03/29/2022  8:45 AM [facility] US4 [facility]    Samara Deist Gena Fray, CNM

## 2022-02-13 ENCOUNTER — Telehealth: Payer: Self-pay | Admitting: Student

## 2022-02-13 NOTE — Telephone Encounter (Signed)
Attempted to call patient to inquire about Centering pregnancy group; she did not answer and there was no option to leave voicemail. Will try again this afternoon.   Luna Kitchens

## 2022-02-14 ENCOUNTER — Telehealth: Payer: Self-pay | Admitting: Student

## 2022-02-14 NOTE — Telephone Encounter (Signed)
Pt was unable to answer your call . Pt is asking for a return call after 2:30.

## 2022-02-14 NOTE — Telephone Encounter (Signed)
Thank you :)

## 2022-02-14 NOTE — Telephone Encounter (Signed)
Attempted to reach patient about her Centering enrollment; no answer and no ability to leave a message.   Dawn Little

## 2022-03-03 ENCOUNTER — Telehealth: Payer: Self-pay | Admitting: Student

## 2022-03-03 NOTE — Telephone Encounter (Signed)
Tried to call patient to discuss missed appt today.  Phone number would not connect; will try again this afternoon.   Dawn Little

## 2022-03-11 ENCOUNTER — Other Ambulatory Visit: Payer: Self-pay

## 2022-03-11 ENCOUNTER — Encounter: Payer: Self-pay | Admitting: Advanced Practice Midwife

## 2022-03-11 DIAGNOSIS — Z3480 Encounter for supervision of other normal pregnancy, unspecified trimester: Secondary | ICD-10-CM

## 2022-03-12 LAB — CBC
Hematocrit: 35.1 % (ref 34.0–46.6)
Hemoglobin: 11.7 g/dL (ref 11.1–15.9)
MCH: 31.4 pg (ref 26.6–33.0)
MCHC: 33.3 g/dL (ref 31.5–35.7)
MCV: 94 fL (ref 79–97)
Platelets: 274 10*3/uL (ref 150–450)
RBC: 3.73 x10E6/uL — ABNORMAL LOW (ref 3.77–5.28)
RDW: 13.1 % (ref 11.7–15.4)
WBC: 11.6 10*3/uL — ABNORMAL HIGH (ref 3.4–10.8)

## 2022-03-12 LAB — RPR: RPR Ser Ql: NONREACTIVE

## 2022-03-12 LAB — GLUCOSE TOLERANCE, 2 HOURS W/ 1HR
Glucose, 1 hour: 130 mg/dL (ref 70–179)
Glucose, 2 hour: 102 mg/dL (ref 70–152)
Glucose, Fasting: 85 mg/dL (ref 70–91)

## 2022-03-12 LAB — HIV ANTIBODY (ROUTINE TESTING W REFLEX): HIV Screen 4th Generation wRfx: NONREACTIVE

## 2022-03-13 NOTE — Progress Notes (Deleted)
       PRENATAL VISIT NOTE- Centering Pregnancy Cycle {NUMBERS:20191}, Session # {NUMBERS:20191}  Subjective:  Dawn Little is a 36 y.o. G1P0000 at [redacted]w[redacted]d being seen today for ongoing prenatal care through Centering Pregnancy.  She is currently monitored for the following issues for this {Blank single:19197::"high-risk","low-risk"} pregnancy and has Supervision of other normal pregnancy, antepartum on their problem list.  Patient reports {sx:14538}.   .  .   . ***Denies leaking of fluid/ROM.   The following portions of the patient's history were reviewed and updated as appropriate: allergies, current medications, past family history, past medical history, past social history, past surgical history and problem list. Problem list updated.  Objective:  There were no vitals filed for this visit.  Fetal Status:           General:  Alert, oriented and cooperative. Patient is in no acute distress.  Skin: Skin is warm and dry. No rash noted.   Cardiovascular: Normal heart rate noted  Respiratory: Normal respiratory effort, no problems with respiration noted  Abdomen: Soft, gravid, appropriate for gestational age.        Pelvic: {Blank single:19197::"Cervical exam performed","Cervical exam deferred"}        Extremities: Normal range of motion.     Mental Status: Normal mood and affect. Normal behavior. Normal judgment and thought content.   Assessment and Plan:  Pregnancy: G1P0000 at [redacted]w[redacted]d    Centering Pregnancy, Session#8: Reviewed resources in [employer].   Facilitated discussion today:  Induction of labor, placenta, postpartum planning, hospital planning  Fundal height and FHR appropriate today unless noted otherwise in plan. Patient to continue group care.     {Blank single:19197::"Term","Preterm"} labor symptoms and general obstetric precautions including but not limited to vaginal bleeding, contractions, leaking of fluid and fetal movement were reviewed in  detail with the patient. Please refer to After Visit Summary for other counseling recommendations.  No follow-ups on file.  Future Appointments  Date Time Provider Department Center  03/17/2022  9:00 AM CENTERING PROVIDER [facility]  03/29/2022  8:30 AM [facility] NURSE [facility]  03/29/2022  8:45 AM [facility] US4 [facility]  03/31/2022  9:00 AM CENTERING PROVIDER [facility]  04/14/2022  9:00 AM CENTERING PROVIDER [facility]  04/28/2022  9:00 AM CENTERING PROVIDER [facility]  05/12/2022  9:00 AM CENTERING PROVIDER [facility]  05/26/2022  9:00 AM CENTERING PROVIDER [facility]    Marylene Land, CNM

## 2022-03-15 ENCOUNTER — Telehealth: Payer: Self-pay | Admitting: Student

## 2022-03-15 NOTE — Telephone Encounter (Signed)
TC to patient; she expresses that she cannot attend Centering until after August 28 due to childcare (she has an 35 year old daughter). She can come to regular appts and would like some regular appts between now and the 28th. I suggested that I will talk about whether or not patient would be a candidate for centering, given that she can't start until Sept 7.   Patient knows that I will let her know about her appts.   Dawn Little

## 2022-03-17 ENCOUNTER — Telehealth: Payer: Self-pay | Admitting: Student

## 2022-03-17 NOTE — Telephone Encounter (Signed)
TC to patient; she would like top have her last 4 visits as part of Centering so she will start on 9-7

## 2022-03-18 ENCOUNTER — Ambulatory Visit (INDEPENDENT_AMBULATORY_CARE_PROVIDER_SITE_OTHER): Payer: Self-pay | Admitting: Family Medicine

## 2022-03-18 ENCOUNTER — Other Ambulatory Visit: Payer: Self-pay

## 2022-03-18 VITALS — BP 108/68 | HR 114 | Wt 199.8 lb

## 2022-03-18 DIAGNOSIS — Z3483 Encounter for supervision of other normal pregnancy, third trimester: Secondary | ICD-10-CM

## 2022-03-18 DIAGNOSIS — Z789 Other specified health status: Secondary | ICD-10-CM

## 2022-03-18 DIAGNOSIS — Z3A29 29 weeks gestation of pregnancy: Secondary | ICD-10-CM

## 2022-03-18 DIAGNOSIS — Z3480 Encounter for supervision of other normal pregnancy, unspecified trimester: Secondary | ICD-10-CM

## 2022-03-18 NOTE — Progress Notes (Signed)
   PRENATAL VISIT NOTE  Subjective:  Dawn Little is a 36 y.o. G1P0000 at [redacted]w[redacted]d being seen today for ongoing prenatal care.  She is currently monitored for the following issues for this low-risk pregnancy and has Supervision of other normal pregnancy, antepartum on their problem list.  Patient reports Contractions: Not present. Vag. Bleeding: None.  Movement: Present. Denies leaking of fluid.   The following portions of the patient's history were reviewed and updated as appropriate: allergies, current medications, past family history, past medical history, past social history, past surgical history and problem list.   Objective:   Vitals:   03/18/22 0948  BP: 108/68  Pulse: (!) 114  Weight: 199 lb 12.8 oz (90.6 kg)    Fetal Status: Fetal Heart Rate (bpm): 140   Movement: Present     General:  Alert, oriented and cooperative. Patient is in no acute distress.  Skin: Skin is warm and dry. No rash noted.   Cardiovascular: Normal heart rate noted  Respiratory: Normal respiratory effort, no problems with respiration noted  Abdomen: Soft, gravid, appropriate for gestational age.  Pain/Pressure: Present     Pelvic: Cervical exam deferred        Extremities: Normal range of motion.  Edema: Trace  Mental Status: Normal mood and affect. Normal behavior. Normal judgment and thought content.   Assessment and Plan:  Pregnancy: G1P0000 at [redacted]w[redacted]d 1. Supervision of other normal pregnancy, antepartum GTT within normal limits.  28-week lab work was collected at previous visit and came back all within normal limits.  Discussed preterm labor precautions.  Patient is a centering patient but will not be able to follow with them until September.  Follow-up in 2 weeks.  2. [redacted] weeks gestation of pregnancy Routine prenatal care  3. Language barrier Spanish interpreter used throughout visit  4. Difficulty hearing  Patient reports that she submerged her head in water a few days ago and  since then feels like she is been having difficulty hearing.  She does use Q-tips.  Physical exam showing bilateral TMs occluded with cerumen.  Discussed use of Debrox and discontinuation of use of Q-tips.  Follow-up as needed.  Preterm labor symptoms and general obstetric precautions including but not limited to vaginal bleeding, contractions, leaking of fluid and fetal movement were reviewed in detail with the patient. Please refer to After Visit Summary for other counseling recommendations.   Return in about 2 weeks (around 04/01/2022) for 32 wk OB visit .  Future Appointments  Date Time Provider Department Center  03/29/2022  8:30 AM [facility]URSE [facility]  03/29/2022  8:45 AM [facility] US4 [facility]  04/04/2022  2:15 PM Celedonio Savage, MD [facility]  04/14/2022  9:00 AM CENTERING PROVIDER [facility]  04/28/2022  9:00 AM CENTERING PROVIDER [facility]  05/12/2022  9:00 AM CENTERING PROVIDER [facility]  05/26/2022  9:00 AM CENTERING PROVIDER [facility]    Celedonio Savage, MD

## 2022-03-29 ENCOUNTER — Encounter: Payer: Self-pay | Admitting: *Deleted

## 2022-03-29 ENCOUNTER — Ambulatory Visit: Payer: Self-pay | Attending: Obstetrics and Gynecology

## 2022-03-29 ENCOUNTER — Ambulatory Visit: Payer: Self-pay | Admitting: *Deleted

## 2022-03-29 VITALS — BP 108/68 | HR 120

## 2022-03-29 DIAGNOSIS — Z3480 Encounter for supervision of other normal pregnancy, unspecified trimester: Secondary | ICD-10-CM | POA: Insufficient documentation

## 2022-03-29 DIAGNOSIS — O99213 Obesity complicating pregnancy, third trimester: Secondary | ICD-10-CM | POA: Insufficient documentation

## 2022-03-29 DIAGNOSIS — O09513 Supervision of elderly primigravida, third trimester: Secondary | ICD-10-CM | POA: Insufficient documentation

## 2022-03-29 DIAGNOSIS — E669 Obesity, unspecified: Secondary | ICD-10-CM

## 2022-03-29 DIAGNOSIS — Z362 Encounter for other antenatal screening follow-up: Secondary | ICD-10-CM | POA: Insufficient documentation

## 2022-03-29 DIAGNOSIS — O09522 Supervision of elderly multigravida, second trimester: Secondary | ICD-10-CM | POA: Insufficient documentation

## 2022-03-29 DIAGNOSIS — Z3A31 31 weeks gestation of pregnancy: Secondary | ICD-10-CM | POA: Insufficient documentation

## 2022-04-04 ENCOUNTER — Ambulatory Visit (INDEPENDENT_AMBULATORY_CARE_PROVIDER_SITE_OTHER): Payer: Self-pay | Admitting: Family Medicine

## 2022-04-04 ENCOUNTER — Other Ambulatory Visit: Payer: Self-pay

## 2022-04-04 VITALS — BP 117/62 | HR 112 | Wt 205.8 lb

## 2022-04-04 DIAGNOSIS — Z3A30 30 weeks gestation of pregnancy: Secondary | ICD-10-CM

## 2022-04-04 DIAGNOSIS — Z789 Other specified health status: Secondary | ICD-10-CM

## 2022-04-04 DIAGNOSIS — Z3480 Encounter for supervision of other normal pregnancy, unspecified trimester: Secondary | ICD-10-CM

## 2022-04-04 DIAGNOSIS — O3663X1 Maternal care for excessive fetal growth, third trimester, fetus 1: Secondary | ICD-10-CM

## 2022-04-04 NOTE — Progress Notes (Signed)
   PRENATAL VISIT NOTE  Subjective:  Dawn Little is a 36 y.o. G1P0000 at [redacted]w[redacted]d being seen today for ongoing prenatal care.  She is currently monitored for the following issues for this high-risk pregnancy and has Supervision of other normal pregnancy, antepartum on their problem list.  Patient reports no bleeding, no contractions, no cramping, and no leaking.  Contractions: Not present. Vag. Bleeding: None.  Movement: Present. Denies leaking of fluid.   The following portions of the patient's history were reviewed and updated as appropriate: allergies, current medications, past family history, past medical history, past social history, past surgical history and problem list.   Objective:   Vitals:   04/04/22 1413  BP: 117/62  Pulse: (!) 112  Weight: 205 lb 12.8 oz (93.4 kg)    Fetal Status: Fetal Heart Rate (bpm): 135   Movement: Present     General:  Alert, oriented and cooperative. Patient is in no acute distress.  Skin: Skin is warm and dry. No rash noted.   Cardiovascular: Normal heart rate noted  Respiratory: Normal respiratory effort, no problems with respiration noted  Abdomen: Soft, gravid, appropriate for gestational age.  Pain/Pressure: Absent     Pelvic: Cervical exam deferred        Extremities: Normal range of motion.     Mental Status: Normal mood and affect. Normal behavior. Normal judgment and thought content.   Assessment and Plan:  Pregnancy: G1P0000 at [redacted]w[redacted]d 1. Supervision of other normal pregnancy, antepartum GTT and 28-week lab work within normal limits.  Discussed preterm labor precautions.  Follow-up in 2 weeks.  2. Language barrier Spanish interpreter used throughout visit  3. [redacted] weeks gestation of pregnancy Repeat growth ultrasound in 3 weeks per MFM's request.    4. Excessive fetal growth affecting management of pregnancy in third trimester, fetus 1 of multiple gestation Repeat growth scan 4 weeks after previous growth scan.  Most  recent scan showing fetus at the 98th percentile.  Patient reports she was told by MFM she will likely need a cesarean section.  Discussed the possibilities of vaginal birth versus cesarean section and that we will continue to monitor and that there is no definitive decision which method would be best.  Patient very appreciative of the discussion and will follow-up with maternal-fetal medicine for repeat growth ultrasound around the 19th of next month.  Preterm labor symptoms and general obstetric precautions including but not limited to vaginal bleeding, contractions, leaking of fluid and fetal movement were reviewed in detail with the patient. Please refer to After Visit Summary for other counseling recommendations.   No follow-ups on file.  Future Appointments  Date Time Provider Department Center  04/14/2022  9:00 AM CENTERING PROVIDER [facility]  04/26/2022  1:15 PM [facility] NURSE [facility]  04/26/2022  1:30 PM [facility] US3 [facility]  04/28/2022  9:00 AM CENTERING PROVIDER [facility]  05/12/2022  9:00 AM CENTERING PROVIDER [facility]  05/26/2022  9:00 AM CENTERING PROVIDER [facility]    Celedonio Savage, MD

## 2022-04-14 ENCOUNTER — Encounter: Payer: Self-pay | Admitting: Student

## 2022-04-26 ENCOUNTER — Other Ambulatory Visit: Payer: Self-pay | Admitting: *Deleted

## 2022-04-26 ENCOUNTER — Ambulatory Visit: Payer: Self-pay | Admitting: *Deleted

## 2022-04-26 ENCOUNTER — Ambulatory Visit: Payer: Self-pay | Attending: Obstetrics and Gynecology

## 2022-04-26 VITALS — BP 107/65 | HR 91

## 2022-04-26 DIAGNOSIS — O3663X1 Maternal care for excessive fetal growth, third trimester, fetus 1: Secondary | ICD-10-CM

## 2022-04-26 DIAGNOSIS — O99213 Obesity complicating pregnancy, third trimester: Secondary | ICD-10-CM | POA: Insufficient documentation

## 2022-04-26 DIAGNOSIS — Z3480 Encounter for supervision of other normal pregnancy, unspecified trimester: Secondary | ICD-10-CM | POA: Insufficient documentation

## 2022-04-26 DIAGNOSIS — O09513 Supervision of elderly primigravida, third trimester: Secondary | ICD-10-CM | POA: Insufficient documentation

## 2022-04-26 DIAGNOSIS — Z3A35 35 weeks gestation of pregnancy: Secondary | ICD-10-CM | POA: Insufficient documentation

## 2022-04-26 DIAGNOSIS — O09523 Supervision of elderly multigravida, third trimester: Secondary | ICD-10-CM | POA: Insufficient documentation

## 2022-04-26 DIAGNOSIS — O3663X0 Maternal care for excessive fetal growth, third trimester, not applicable or unspecified: Secondary | ICD-10-CM

## 2022-04-27 ENCOUNTER — Telehealth: Payer: Self-pay | Admitting: Student

## 2022-04-27 DIAGNOSIS — O3660X0 Maternal care for excessive fetal growth, unspecified trimester, not applicable or unspecified: Secondary | ICD-10-CM | POA: Insufficient documentation

## 2022-04-27 NOTE — Telephone Encounter (Signed)
Called patient twice, first time no answer and did not leave message, then called back and left message in Spanish reminding her of appt tomorrow. Also tried  patient contact [place] and no vmail available.   KK

## 2022-04-28 ENCOUNTER — Ambulatory Visit (INDEPENDENT_AMBULATORY_CARE_PROVIDER_SITE_OTHER): Payer: Self-pay | Admitting: Student

## 2022-04-28 ENCOUNTER — Other Ambulatory Visit: Payer: Self-pay

## 2022-04-28 VITALS — BP 124/68 | HR 112 | Wt 216.4 lb

## 2022-04-28 DIAGNOSIS — O3663X0 Maternal care for excessive fetal growth, third trimester, not applicable or unspecified: Secondary | ICD-10-CM

## 2022-04-28 DIAGNOSIS — Z3480 Encounter for supervision of other normal pregnancy, unspecified trimester: Secondary | ICD-10-CM

## 2022-04-28 DIAGNOSIS — Z23 Encounter for immunization: Secondary | ICD-10-CM

## 2022-04-28 NOTE — Progress Notes (Signed)
       PRENATAL VISIT NOTE- Centering Pregnancy Cycle 3, Session # 12  Subjective:  Dawn Little is a 36 y.o. G1P0000 at [redacted]w[redacted]d being seen today for ongoing prenatal care through Centering Pregnancy.  She is currently monitored for the following issues for this low-risk pregnancy and has Supervision of other normal pregnancy, antepartum and LGA (large for gestational age) fetus affecting management of mother on their problem list.  Patient reports no complaints.   She has questions about her [place]. She would like to know if she needs a c/section; she would like to know if her baby will be ok. Contractions: Not present. Vag. Bleeding: None.  Movement: Present. Denies leaking of fluid/ROM.   The following portions of the patient's history were reviewed and updated as appropriate: allergies, current medications, past family history, past medical history, past social history, past surgical history and problem list. Problem list updated.  Objective:   Vitals:   04/28/22 0936  BP: 124/68  Pulse: (!) 112  Weight: 216 lb 6.4 oz (98.2 kg)    Fetal Status: Fetal Heart Rate (bpm): 142 Fundal Height: 35 cm Movement: Present     General:  Alert, oriented and cooperative. Patient is in no acute distress.  Skin: Skin is warm and dry. No rash noted.   Cardiovascular: Normal heart rate noted  Respiratory: Normal respiratory effort, no problems with respiration noted  Abdomen: Soft, gravid, appropriate for gestational age.  Pain/Pressure: Present     Pelvic: Cervical exam deferred        Extremities: Normal range of motion.     Mental Status: Normal mood and affect. Normal behavior. Normal judgment and thought content.   Assessment and Plan:  Pregnancy: G1P0000 at [redacted]w[redacted]d 1. Supervision of other normal pregnancy, antepartum   2. Excessive fetal growth affecting management of pregnancy in third trimester, single or unspecified fetus       Centering Pregnancy, Session#3: Reviewed  resources in [employer].   Facilitated discussion today:  welcomed patient to Centering, discussed signs of labor, when to come to MAU, stages of labor and had presentation with Sharon Hice  Reviewed [place] and GTT, patient does not have diabetes although EFW was in 99% at 35 weeks.   Discussed that patient does not need to make decision about c/section yet; would not recommend that she has pCS without an [place] and to consider that [place] can be inaccurate the further along in pregnancy.   Fundal height and FHR appropriate today unless noted otherwise in plan. Patient to continue group care.     Preterm labor symptoms and general obstetric precautions including but not limited to vaginal bleeding, contractions, leaking of fluid and fetal movement were reviewed in detail with the patient. Please refer to After Visit Summary for other counseling recommendations.  No follow-ups on file.  Future Appointments  Date Time Provider [place]  05/05/2022 10:55 AM Stormy Card, MD [facility]  05/12/2022  9:00 AM CENTERING PROVIDER [facility]  05/19/2022  9:35 AM Deloris Ping, CNM [facility]  05/24/2022  3:30 PM [facility] NURSE [facility]  05/24/2022  3:45 PM [facility] US1 [facility]  05/26/2022  9:15 AM Minette Brine [facility]  06/02/2022  8:15 AM [facility] NST [facility]  06/02/2022  9:15 AM Renee Harder, CNM [facility]    Starr Lake, CNM

## 2022-05-05 ENCOUNTER — Other Ambulatory Visit: Payer: Self-pay

## 2022-05-05 ENCOUNTER — Other Ambulatory Visit (HOSPITAL_COMMUNITY)
Admission: RE | Admit: 2022-05-05 | Discharge: 2022-05-05 | Disposition: A | Discharge status: Home / Self Care | Payer: Self-pay | Source: Ambulatory Visit | Attending: Family Medicine | Admitting: Family Medicine

## 2022-05-05 ENCOUNTER — Ambulatory Visit (INDEPENDENT_AMBULATORY_CARE_PROVIDER_SITE_OTHER): Payer: Self-pay | Admitting: Family Medicine

## 2022-05-05 VITALS — BP 126/76 | HR 116

## 2022-05-05 DIAGNOSIS — Z789 Other specified health status: Secondary | ICD-10-CM

## 2022-05-05 DIAGNOSIS — Z3480 Encounter for supervision of other normal pregnancy, unspecified trimester: Secondary | ICD-10-CM

## 2022-05-05 DIAGNOSIS — O3663X0 Maternal care for excessive fetal growth, third trimester, not applicable or unspecified: Secondary | ICD-10-CM

## 2022-05-05 DIAGNOSIS — Z3483 Encounter for supervision of other normal pregnancy, third trimester: Secondary | ICD-10-CM

## 2022-05-05 DIAGNOSIS — Z3A36 36 weeks gestation of pregnancy: Secondary | ICD-10-CM

## 2022-05-05 NOTE — Addendum Note (Signed)
Addended by: Langston Reusing on: 05/05/2022 11:15 AM   Modules accepted: Orders

## 2022-05-05 NOTE — Progress Notes (Addendum)
   PRENATAL VISIT NOTE  Subjective:  Dawn Little is a 36 y.o. G1P0000 at [redacted]w[redacted]d being seen today for ongoing prenatal care.  She is currently monitored for the following issues for this low-risk pregnancy and has Supervision of other normal pregnancy, antepartum; LGA (large for gestational age) fetus affecting management of mother; and Language barrier on their problem list.  Patient reports no complaints.  Contractions: Irritability. Vag. Bleeding: None.  Movement: Present. Denies leaking of fluid.   The following portions of the patient's history were reviewed and updated as appropriate: allergies, current medications, past family history, past medical history, past social history, past surgical history and problem list.   Objective:   Vitals:   05/05/22 1100  BP: 126/76  Pulse: (!) 116    Fetal Status: Fetal Heart Rate (bpm): 135 Fundal Height: 37 cm Movement: Present  Presentation: Vertex  General:  Alert, oriented and cooperative. Patient is in no acute distress.  Skin: Skin is warm and dry. No rash noted.   Cardiovascular: Normal heart rate noted  Respiratory: Normal respiratory effort, no problems with respiration noted  Abdomen: Soft, gravid, appropriate for gestational age. Cephalic presentation per Leopold's.  Pain/Pressure: Present     Pelvic: Cervical exam deferred  Extremities: Normal range of motion.  Edema: Moderate pitting, indentation subsides rapidly  Mental Status: Normal mood and affect. Normal behavior. Normal judgment and thought content.   Assessment and Plan:  Pregnancy: G1P0000 at [redacted]w[redacted]d 1. Supervision of other normal pregnancy, antepartum Doing well overall. No concerns today. GBS and GC/chlamydia screen collected - Culture, beta strep (group b only) - GC/Chlamydia probe amp ([place])not at [facility]  2. Excessive fetal growth affecting management of pregnancy in third trimester, single or unspecified fetus Previous US shows >99th %ile. May  need induction at 22 weeks, C-section recommended if EFW > 5kg.  - has growth [place] scheduled fro 10/17 ~ 38weeks+  Language barrier Spanish speaking only. In person interpreter helped with visit.  [redacted] weeks gestation of pregnancy    Preterm labor symptoms and general obstetric precautions including but not limited to vaginal bleeding, contractions, leaking of fluid and fetal movement were reviewed in detail with the patient. Please refer to After Visit Summary for other counseling recommendations.   Follow up in 1 week for routine OB visit.  Future Appointments  Date Time Provider [place]  05/12/2022  9:00 AM CENTERING PROVIDER [facility]  05/19/2022  9:35 AM Deloris Ping, CNM [facility]  05/24/2022  3:30 PM W[facility] NURSE [facility]  05/24/2022  3:45 PM [facility] US1 [facility]  05/26/2022  9:15 AM Minette Brine [facility]  06/02/2022  8:15 AM [facility] NST [facility]  06/02/2022  9:15 AM Renee Harder, CNM [facility]    Liliane Channel MD MPH OB Fellow, [place] for [facility] 05/05/2022

## 2022-05-06 LAB — GC/CHLAMYDIA PROBE AMP (~~LOC~~) NOT AT ARMC
Chlamydia: NEGATIVE
Comment: NEGATIVE
Comment: NORMAL
Neisseria Gonorrhea: NEGATIVE

## 2022-05-07 NOTE — Progress Notes (Unsigned)
       PRENATAL VISIT NOTE- Centering Pregnancy Cycle 3, Session # 13  Subjective:  Dawn Little is a 36 y.o. G1P0000 at [redacted]w[redacted]d being seen today for ongoing prenatal care through Centering Pregnancy.  She is currently monitored for the following issues for this {Blank single:19197::"high-risk","low-risk"} pregnancy and has Supervision of other normal pregnancy, antepartum; LGA (large for gestational age) fetus affecting management of mother; and Language barrier on their problem list.  Patient reports {sx:[id number]}.   .  .   . ***Denies leaking of fluid/ROM.   The following portions of the patient's history were reviewed and updated as appropriate: allergies, current medications, past family history, past medical history, past social history, past surgical history and problem list. Problem list updated.  Objective:  There were no vitals filed for this visit.  Fetal Status:           General:  Alert, oriented and cooperative. Patient is in no acute distress.  Skin: Skin is warm and dry. No rash noted.   Cardiovascular: Normal heart rate noted  Respiratory: Normal respiratory effort, no problems with respiration noted  Abdomen: Soft, gravid, appropriate for gestational age.        Pelvic: {Blank single:19197::"Cervical exam performed","Cervical exam deferred"}        Extremities: Normal range of motion.     Mental Status: Normal mood and affect. Normal behavior. Normal judgment and thought content.   Assessment and Plan:  Pregnancy: G1P0000 at [redacted]w[redacted]d  There are no diagnoses linked to this encounter.  *** add [facility] dot phrase for session   {Blank single:19197::"Term","Preterm"} labor symptoms and general obstetric precautions including but not limited to vaginal bleeding, contractions, leaking of fluid and fetal movement were reviewed in detail with the patient. Please refer to After Visit Summary for other counseling recommendations.  No follow-ups on file.  Future  Appointments  Date Time Provider [place]  05/12/2022  9:00 AM CENTERING PROVIDER [facility]  05/19/2022  9:15 AM Deloris Ping, CNM [facility]  05/24/2022  3:30 PM W[facility] NURSE [facility]  05/24/2022  3:45 PM [facility] US1 [facility]  05/26/2022  9:15 AM Minette Brine [facility]  06/02/2022  8:15 AM [facility] NST [facility]  06/02/2022  9:15 AM Renee Harder, CNM [facility]    Starr Lake, CNM

## 2022-05-09 LAB — CULTURE, BETA STREP (GROUP B ONLY): Strep Gp B Culture: NEGATIVE

## 2022-05-12 ENCOUNTER — Ambulatory Visit (INDEPENDENT_AMBULATORY_CARE_PROVIDER_SITE_OTHER): Payer: Self-pay | Admitting: Student

## 2022-05-12 VITALS — BP 124/76 | HR 123 | Wt 216.8 lb

## 2022-05-12 DIAGNOSIS — Z3480 Encounter for supervision of other normal pregnancy, unspecified trimester: Secondary | ICD-10-CM

## 2022-05-12 DIAGNOSIS — L299 Pruritus, unspecified: Secondary | ICD-10-CM

## 2022-05-12 DIAGNOSIS — Z3A37 37 weeks gestation of pregnancy: Secondary | ICD-10-CM

## 2022-05-12 NOTE — Progress Notes (Signed)
Pt reports itching of both hands

## 2022-05-14 ENCOUNTER — Telehealth: Payer: Self-pay | Admitting: Student

## 2022-05-14 LAB — COMPREHENSIVE METABOLIC PANEL
ALT: 6 IU/L (ref 0–32)
AST: 19 IU/L (ref 0–40)
Albumin/Globulin Ratio: 1.1 — ABNORMAL LOW (ref 1.2–2.2)
Albumin: 3.5 g/dL — ABNORMAL LOW (ref 3.9–4.9)
Alkaline Phosphatase: 244 IU/L — ABNORMAL HIGH (ref 44–121)
BUN/Creatinine Ratio: 11 (ref 9–23)
BUN: 6 mg/dL (ref 6–20)
Bilirubin Total: 0.2 mg/dL (ref 0.0–1.2)
CO2: 16 mmol/L — ABNORMAL LOW (ref 20–29)
Calcium: 9.2 mg/dL (ref 8.7–10.2)
Chloride: 106 mmol/L (ref 96–106)
Creatinine, Ser: 0.56 mg/dL — ABNORMAL LOW (ref 0.57–1.00)
Globulin, Total: 3.1 g/dL (ref 1.5–4.5)
Glucose: 109 mg/dL — ABNORMAL HIGH (ref 70–99)
Potassium: 4.6 mmol/L (ref 3.5–5.2)
Sodium: 140 mmol/L (ref 134–144)
Total Protein: 6.6 g/dL (ref 6.0–8.5)
eGFR: 123 mL/min/{1.73_m2} (ref >59)

## 2022-05-14 LAB — BILE ACIDS, TOTAL: Bile Acids Total: 6.9 umol/L (ref 0.0–10.0)

## 2022-05-14 NOTE — Telephone Encounter (Signed)
TC to patient to discuss her bile acid results, which were normal. Patient also had questions about her CMP results, which I explained were also normal. Spoke with FOB christopher and reviewed labor precautions,  signs of labor and when to come to hospital. He expressed anxiety over "baby getting too big" and states that they want to try to start labor at home. I advised to continue normal activity, although can walk more if she wants. I encouraged them to keep [place] appt this week and that plan can be determined from there. Harrell Gave verbalized understanding.   Curt Bears

## 2022-05-19 ENCOUNTER — Ambulatory Visit (INDEPENDENT_AMBULATORY_CARE_PROVIDER_SITE_OTHER): Payer: Self-pay | Admitting: Certified Nurse Midwife

## 2022-05-19 ENCOUNTER — Other Ambulatory Visit: Payer: Self-pay

## 2022-05-19 VITALS — BP 123/84 | HR 108 | Wt 216.4 lb

## 2022-05-19 DIAGNOSIS — R400 Somnolence: Secondary | ICD-10-CM

## 2022-05-19 DIAGNOSIS — M25475 Effusion, left foot: Secondary | ICD-10-CM

## 2022-05-19 DIAGNOSIS — Z3483 Encounter for supervision of other normal pregnancy, third trimester: Secondary | ICD-10-CM

## 2022-05-19 DIAGNOSIS — Z603 Acculturation difficulty: Secondary | ICD-10-CM

## 2022-05-19 DIAGNOSIS — M25472 Effusion, left ankle: Secondary | ICD-10-CM

## 2022-05-19 DIAGNOSIS — M25474 Effusion, right foot: Secondary | ICD-10-CM

## 2022-05-19 DIAGNOSIS — Z3A38 38 weeks gestation of pregnancy: Secondary | ICD-10-CM

## 2022-05-19 DIAGNOSIS — Z3480 Encounter for supervision of other normal pregnancy, unspecified trimester: Secondary | ICD-10-CM

## 2022-05-19 DIAGNOSIS — Z789 Other specified health status: Secondary | ICD-10-CM

## 2022-05-19 DIAGNOSIS — O3663X0 Maternal care for excessive fetal growth, third trimester, not applicable or unspecified: Secondary | ICD-10-CM

## 2022-05-19 DIAGNOSIS — M25471 Effusion, right ankle: Secondary | ICD-10-CM

## 2022-05-19 NOTE — Progress Notes (Signed)
PRENATAL VISIT NOTE  Subjective:  Dawn Little is a 36 y.o. G1P0000 at [redacted]w[redacted]d being seen today for ongoing prenatal care.  She is currently monitored for the following issues for this low-risk pregnancy and has Supervision of other normal pregnancy, antepartum; LGA (large for gestational age) fetus affecting management of mother; and Language barrier on their problem list.  Patient reports being frequently drowsy throughout the day, due to not being able to sleep at night. Patient states that she frequent has to get up throughout the night to urinate and has difficultly going back to sleep afterwards. She also noted bilateral feet and ankle swelling throughout the day. Endorses improvement with rest.   Contractions: Irregular. Vag. Bleeding: None.  Movement: Present. Denies leaking of fluid.   The following portions of the patient's history were reviewed and updated as appropriate: allergies, current medications, past family history, past medical history, past social history, past surgical history and problem list.   Objective:   Vitals:   05/19/22 0934  BP: 123/84  Pulse: (!) 108  Weight: 216 lb 6.4 oz (98.2 kg)    Fetal Status: Fetal Heart Rate (bpm): 145 Fundal Height: 38 cm Movement: Present     General:  Alert, oriented and cooperative. Patient is in no acute distress.  Skin: Skin is warm and dry. No rash noted.   Cardiovascular: Normal heart rate noted  Respiratory: Normal respiratory effort, no problems with respiration noted  Abdomen: Soft, gravid, appropriate for gestational age.  Pain/Pressure: Present     Pelvic: Cervical exam deferred        Extremities: Normal range of motion.  Edema: Moderate pitting, indentation subsides rapidly  Mental Status: Normal mood and affect. Normal behavior. Normal judgment and thought content.   Assessment and Plan:  Pregnancy: G1P0000 at [redacted]w[redacted]d 1. Supervision of other normal pregnancy, antepartum - Patient feeling frequent  and vigorous fetal movement  -  2. Language barrier - In person interpreter Eda used for duration of this visit. - Due to language barrier, an interpreter was present during the history-taking and subsequent discussion (and for part of the physical exam) with this patient.    3. [redacted] weeks gestation of pregnancy   4. Excessive fetal growth affecting management of pregnancy in third trimester, single or unspecified fetus - Fundal height appropriate for gestational age today. S=D  5. Daytime sleepiness - Recommended the use of an OTC night time sleep aid like 1/2 tab of Unisom or benadryl  at bedtime in hopes that will help patient drift back off to sleep after bathroom usage.   6. Bilateral swelling of feet and ankles - Swelling improves with rest and relaxation.  - Encouraged the use of compression socks and increasing water intake throughout the day.    Term labor symptoms and general obstetric precautions including but not limited to vaginal bleeding, contractions, leaking of fluid and fetal movement were reviewed in detail with the patient. Please refer to After Visit Summary for other counseling recommendations.   Return in about 1 week (around 05/26/2022) for LOB.  Future Appointments  Date Time Provider [place]  05/24/2022  3:30 PM [facility] NURSE [facility]  05/24/2022  3:45 PM [facility] US1 [facility]  05/26/2022  9:15 AM Minette Brine [facility]  06/02/2022  8:15 AM [facility] NST [facility]  06/02/2022  9:15 AM Renee Harder, CNM [facility]    Odetta Forness Isaias Sakai) Rollene Rotunda, MSN, [place] for [facility]  05/19/22 12:47 PM

## 2022-05-24 ENCOUNTER — Encounter: Payer: Self-pay | Admitting: *Deleted

## 2022-05-24 ENCOUNTER — Ambulatory Visit: Payer: Self-pay | Attending: Maternal & Fetal Medicine

## 2022-05-24 ENCOUNTER — Ambulatory Visit: Payer: Self-pay | Admitting: *Deleted

## 2022-05-24 VITALS — BP 120/76 | HR 110

## 2022-05-24 DIAGNOSIS — O99513 Diseases of the respiratory system complicating pregnancy, third trimester: Secondary | ICD-10-CM

## 2022-05-24 DIAGNOSIS — O3663X0 Maternal care for excessive fetal growth, third trimester, not applicable or unspecified: Secondary | ICD-10-CM | POA: Insufficient documentation

## 2022-05-24 DIAGNOSIS — Z3A39 39 weeks gestation of pregnancy: Secondary | ICD-10-CM | POA: Insufficient documentation

## 2022-05-24 DIAGNOSIS — O99213 Obesity complicating pregnancy, third trimester: Secondary | ICD-10-CM | POA: Insufficient documentation

## 2022-05-24 DIAGNOSIS — Z3480 Encounter for supervision of other normal pregnancy, unspecified trimester: Secondary | ICD-10-CM

## 2022-05-24 DIAGNOSIS — E669 Obesity, unspecified: Secondary | ICD-10-CM

## 2022-05-24 DIAGNOSIS — O09513 Supervision of elderly primigravida, third trimester: Secondary | ICD-10-CM | POA: Insufficient documentation

## 2022-05-26 ENCOUNTER — Encounter (HOSPITAL_COMMUNITY): Payer: Self-pay | Admitting: Obstetrics and Gynecology

## 2022-05-26 ENCOUNTER — Inpatient Hospital Stay (HOSPITAL_COMMUNITY)
Admission: AD | Admit: 2022-05-26 | Discharge: 2022-05-30 | DRG: 786 | Disposition: A | Discharge status: Home / Self Care | Payer: Medicaid Other | Attending: Obstetrics & Gynecology | Admitting: Obstetrics & Gynecology

## 2022-05-26 ENCOUNTER — Ambulatory Visit: Payer: Self-pay | Admitting: Advanced Practice Midwife

## 2022-05-26 ENCOUNTER — Other Ambulatory Visit: Payer: Self-pay

## 2022-05-26 ENCOUNTER — Inpatient Hospital Stay (HOSPITAL_COMMUNITY): Payer: Medicaid Other | Admitting: Anesthesiology

## 2022-05-26 DIAGNOSIS — O26893 Other specified pregnancy related conditions, third trimester: Secondary | ICD-10-CM | POA: Diagnosis present

## 2022-05-26 DIAGNOSIS — O3663X0 Maternal care for excessive fetal growth, third trimester, not applicable or unspecified: Secondary | ICD-10-CM | POA: Diagnosis present

## 2022-05-26 DIAGNOSIS — Z3480 Encounter for supervision of other normal pregnancy, unspecified trimester: Principal | ICD-10-CM

## 2022-05-26 DIAGNOSIS — O411230 Chorioamnionitis, third trimester, not applicable or unspecified: Secondary | ICD-10-CM | POA: Diagnosis present

## 2022-05-26 DIAGNOSIS — O4202 Full-term premature rupture of membranes, onset of labor within 24 hours of rupture: Secondary | ICD-10-CM | POA: Diagnosis not present

## 2022-05-26 DIAGNOSIS — O09523 Supervision of elderly multigravida, third trimester: Secondary | ICD-10-CM | POA: Diagnosis not present

## 2022-05-26 DIAGNOSIS — O9902 Anemia complicating childbirth: Secondary | ICD-10-CM | POA: Diagnosis present

## 2022-05-26 DIAGNOSIS — O99214 Obesity complicating childbirth: Secondary | ICD-10-CM | POA: Diagnosis not present

## 2022-05-26 DIAGNOSIS — Z98891 History of uterine scar from previous surgery: Secondary | ICD-10-CM

## 2022-05-26 DIAGNOSIS — Z3A39 39 weeks gestation of pregnancy: Secondary | ICD-10-CM

## 2022-05-26 HISTORY — DX: Other specified health status: Z78.9

## 2022-05-26 LAB — HEMOGLOBIN A1C
Hgb A1c MFr Bld: 5.8 % — ABNORMAL HIGH (ref 4.8–5.6)
Mean Plasma Glucose: 119.76 mg/dL

## 2022-05-26 LAB — CBC
HCT: 36.7 % (ref 36.0–46.0)
Hemoglobin: 12.1 g/dL (ref 12.0–15.0)
MCH: 30.7 pg (ref 26.0–34.0)
MCHC: 33 g/dL (ref 30.0–36.0)
MCV: 93.1 fL (ref 80.0–100.0)
Platelets: 308 10*3/uL (ref 150–400)
RBC: 3.94 MIL/uL (ref 3.87–5.11)
RDW: 14.4 % (ref 11.5–15.5)
WBC: 10.2 10*3/uL (ref 4.0–10.5)
nRBC: 0 % (ref 0.0–0.2)

## 2022-05-26 LAB — TYPE AND SCREEN
ABO/RH(D): O POS
Antibody Screen: NEGATIVE

## 2022-05-26 LAB — RPR: RPR Ser Ql: NONREACTIVE

## 2022-05-26 MED ORDER — PHENYLEPHRINE 80 MCG/ML (10ML) SYRINGE FOR IV PUSH (FOR BLOOD PRESSURE SUPPORT)
80.0000 ug | PREFILLED_SYRINGE | INTRAVENOUS | Status: DC | PRN
  Filled 2022-05-26 (×2): qty 10

## 2022-05-26 MED ORDER — EPHEDRINE 5 MG/ML INJ: 10.0000 mg | INTRAVENOUS | Status: DC | PRN

## 2022-05-26 MED ORDER — PHENYLEPHRINE 80 MCG/ML (10ML) SYRINGE FOR IV PUSH (FOR BLOOD PRESSURE SUPPORT)
80.0000 ug | PREFILLED_SYRINGE | INTRAVENOUS | Status: DC | PRN
  Administered 2022-05-26: 80 ug via INTRAVENOUS

## 2022-05-26 MED ORDER — LIDOCAINE HCL (PF) 1 % IJ SOLN
INTRAMUSCULAR | Status: DC | PRN
  Administered 2022-05-26: 10 mL via EPIDURAL
  Administered 2022-05-26: 2 mL via EPIDURAL

## 2022-05-26 MED ORDER — FENTANYL-BUPIVACAINE-NACL 0.5-0.125-0.9 MG/250ML-% EP SOLN
12.0000 mL/h | EPIDURAL | Status: DC | PRN
  Filled 2022-05-26: qty 250

## 2022-05-26 MED ORDER — LACTATED RINGERS IV SOLN: 500.0000 mL | INTRAVENOUS | Status: DC | PRN

## 2022-05-26 MED ORDER — OXYTOCIN BOLUS FROM INFUSION: 333.0000 mL | Freq: Once | INTRAVENOUS | Status: DC

## 2022-05-26 MED ORDER — SOD CITRATE-CITRIC ACID 500-334 MG/5ML PO SOLN
30.0000 mL | ORAL | Status: DC | PRN
  Filled 2022-05-26: qty 30

## 2022-05-26 MED ORDER — OXYTOCIN-SODIUM CHLORIDE 30-0.9 UT/500ML-% IV SOLN: 2.5000 [IU]/h | INTRAVENOUS | Status: DC

## 2022-05-26 MED ORDER — FENTANYL CITRATE (PF) 100 MCG/2ML IJ SOLN
50.0000 ug | INTRAMUSCULAR | Status: DC | PRN
  Administered 2022-05-26 (×3): 100 ug via INTRAVENOUS
  Filled 2022-05-26 (×2): qty 2

## 2022-05-26 MED ORDER — LACTATED RINGERS IV SOLN: INTRAVENOUS | Status: DC

## 2022-05-26 MED ORDER — FENTANYL CITRATE (PF) 100 MCG/2ML IJ SOLN
INTRAMUSCULAR | Status: AC
  Filled 2022-05-26: qty 2

## 2022-05-26 MED ORDER — FENTANYL-BUPIVACAINE-NACL 0.5-0.125-0.9 MG/250ML-% EP SOLN
EPIDURAL | Status: DC | PRN
  Administered 2022-05-26: 12 mL/h via EPIDURAL

## 2022-05-26 MED ORDER — FLEET ENEMA 7-19 GM/118ML RE ENEM: 1.0000 | ENEMA | RECTAL | Status: DC | PRN

## 2022-05-26 MED ORDER — SODIUM CHLORIDE 0.9 % IV SOLN
2.0000 g | Freq: Once | INTRAVENOUS | Status: AC
  Administered 2022-05-26: 2 g via INTRAVENOUS
  Filled 2022-05-26: qty 2000

## 2022-05-26 MED ORDER — ACETAMINOPHEN 325 MG PO TABS: 650.0000 mg | ORAL_TABLET | ORAL | Status: DC | PRN

## 2022-05-26 MED ORDER — ONDANSETRON HCL 4 MG/2ML IJ SOLN
4.0000 mg | Freq: Four times a day (QID) | INTRAMUSCULAR | Status: DC | PRN
  Administered 2022-05-26: 4 mg via INTRAVENOUS
  Filled 2022-05-26: qty 2

## 2022-05-26 MED ORDER — OXYCODONE-ACETAMINOPHEN 5-325 MG PO TABS: 1.0000 | ORAL_TABLET | ORAL | Status: DC | PRN

## 2022-05-26 MED ORDER — OXYCODONE-ACETAMINOPHEN 5-325 MG PO TABS: 2.0000 | ORAL_TABLET | ORAL | Status: DC | PRN

## 2022-05-26 MED ORDER — ACETAMINOPHEN 10 MG/ML IV SOLN
1000.0000 mg | Freq: Once | INTRAVENOUS | Status: AC
  Administered 2022-05-26: 1000 mg via INTRAVENOUS
  Filled 2022-05-26: qty 100

## 2022-05-26 MED ORDER — LACTATED RINGERS IV SOLN: 500.0000 mL | Freq: Once | INTRAVENOUS | Status: DC

## 2022-05-26 MED ORDER — LIDOCAINE HCL (PF) 1 % IJ SOLN: 30.0000 mL | INTRAMUSCULAR | Status: DC | PRN

## 2022-05-26 MED ORDER — DIPHENHYDRAMINE HCL 50 MG/ML IJ SOLN: 12.5000 mg | INTRAMUSCULAR | Status: DC | PRN

## 2022-05-26 MED ORDER — GENTAMICIN SULFATE 40 MG/ML IJ SOLN
5.0000 mg/kg | INTRAVENOUS | Status: DC
  Administered 2022-05-27: 330 mg via INTRAVENOUS
  Filled 2022-05-26: qty 8.25

## 2022-05-26 NOTE — Anesthesia Procedure Notes (Signed)
Epidural Patient location during procedure: OB Start time: 05/26/2022 2:53 PM End time: 05/26/2022 3:05 PM  Staffing Anesthesiologist: Pervis Hocking, DO Performed: anesthesiologist   Preanesthetic Checklist Completed: patient identified, IV checked, risks and benefits discussed, monitors and equipment checked, pre-op evaluation and timeout performed  Epidural Patient position: sitting Prep: DuraPrep and site prepped and draped Patient monitoring: continuous pulse ox, blood pressure, heart rate and cardiac monitor Approach: midline Location: L3-L4 Injection technique: LOR air  Needle:  Needle type: Tuohy  Needle gauge: 17 G Needle length: 9 cm Needle insertion depth: 7 cm Catheter type: closed end flexible Catheter size: 19 Gauge Catheter at skin depth: 12 cm Test dose: negative  Assessment Sensory level: T8 Events: blood not aspirated, injection not painful, no injection resistance, no paresthesia and negative IV test  Additional Notes Patient identified. Risks/Benefits/Options discussed with patient including but not limited to bleeding, infection, nerve damage, paralysis, failed block, incomplete pain control, headache, blood pressure changes, nausea, vomiting, reactions to medication both or allergic, itching and postpartum back pain. Confirmed with bedside nurse the patient's most recent platelet count. Confirmed with patient that they are not currently taking any anticoagulation, have any bleeding history or any family history of bleeding disorders. Patient expressed understanding and wished to proceed. All questions were answered. Sterile technique was used throughout the entire procedure. Please see nursing notes for vital signs. Test dose was given through epidural catheter and negative prior to continuing to dose epidural or start infusion. Warning signs of high block given to the patient including shortness of breath, tingling/numbness in hands, complete motor  block, or any concerning symptoms with instructions to call for help. Patient was given instructions on fall risk and not to get out of bed. All questions and concerns addressed with instructions to call with any issues or inadequate analgesia.  Reason for block:procedure for pain

## 2022-05-26 NOTE — MAU Note (Signed)
..  Dawn Little is a 36 y.o. at [redacted]w[redacted]d here in MAU reporting: contractions since yesterday that are now every 5 minutes. +FM Denies leaking of fluid. Some bloody show.   Pain score: 5/10

## 2022-05-26 NOTE — MAU Provider Note (Signed)
History   [id number]   Chief Complaint  Patient presents with   Contractions    HPI Dawn Little is a 36 y.o. female  G1P0000 @39 .3 wks here with report of underwear has been wet.  Leaking of fluid has continued. Pt reports regular contractions q5 min. She denies vaginal bleeding. She reports good fetal movement. All other systems negative.    Patient's last menstrual period was 09/03/2021.  OB History  Gravida Para Term Preterm AB Living  1 0 0 0 0 0  SAB IAB Ectopic Multiple Live Births  0 0 0 0 0    # Outcome Date GA Lbr Len/2nd Weight Sex Delivery Anes PTL Lv  1 Current             History reviewed. No pertinent past medical history.  Family History  Problem Relation Age of Onset   Asthma Neg Hx    Cancer Neg Hx    Diabetes Neg Hx    Heart disease Neg Hx    Hypertension Neg Hx     Social History   Socioeconomic History   Marital status: Unknown    Spouse name: Not on file   Number of children: Not on file   Years of education: Not on file   Highest education level: Some college, no degree  Occupational History   Not on file  Tobacco Use   Smoking status: Never    Passive exposure: Never   Smokeless tobacco: Never  Vaping Use   Vaping Use: Never used  Substance and Sexual Activity   Alcohol use: Not Currently    Comment: not while preg   Drug use: Never   Sexual activity: Yes    Birth control/protection: None  Other Topics Concern   Not on file  Social History Narrative   Not on file   Social Determinants of Health   Financial Resource Strain: Low Risk  (04/04/2022)   Overall Financial Resource Strain (CARDIA)    Difficulty of Paying Living Expenses: Not very hard  Food Insecurity: Food Insecurity Present (05/12/2022)   Hunger Vital Sign    Worried About Running Out of Food in the Last Year: Sometimes true    Ran Out of Food in the Last Year: Sometimes true  Transportation Needs: No Transportation Needs (05/12/2022)   PRAPARE  - 07/12/2022 (Medical): No    Lack of Transportation (Non-Medical): No  Physical Activity: Insufficiently Active (04/04/2022)   Exercise Vital Sign    Days of Exercise per Week: 3 days    Minutes of Exercise per Session: 20 min  Stress: Not on file  Social Connections: Moderately Integrated (04/04/2022)   Social Connection and Isolation Panel [NHANES]    Frequency of Communication with Friends and Family: More than three times a week    Frequency of Social Gatherings with Friends and Family: Three times a week    Attends Religious Services: 1 to 4 times per year    Active Member of Clubs or Organizations: No    Attends 04/06/2022 Meetings: Not on file    Marital Status: Living with partner    No Known Allergies  No current facility-administered medications on file prior to encounter.   Current Outpatient Medications on File Prior to Encounter  Medication Sig Dispense Refill   Prenatal Vit-Fe Fumarate-FA (MULTIVITAMIN-PRENATAL) 27-0.8 MG TABS tablet Take 1 tablet by mouth daily at 12 noon.       Review of Systems  Gastrointestinal:  Positive for abdominal pain.  Genitourinary:  Positive for vaginal discharge. Negative for vaginal bleeding.     Physical Exam   Vitals:   05/26/22 0405 05/26/22 0412 05/26/22 0425 05/26/22 0435  BP: 135/74 (!) 143/86 136/79 (!) 123/59  Pulse: 98 99 94 93  Resp: 17     Temp: 98.3 F (36.8 C)     TempSrc: Oral     SpO2: 100%     Weight: 98.4 kg     Height:        Physical Exam Vitals and nursing note reviewed. Exam conducted with a chaperone present.  Constitutional:      General: She is not in acute distress.    Appearance: Normal appearance.  HENT:     Head: Normocephalic and atraumatic.  Pulmonary:     Effort: Pulmonary effort is normal. No respiratory distress.  Genitourinary:    Comments: +pool, fern pos Musculoskeletal:        General: Normal range of motion.     Cervical back: Normal  range of motion.  Skin:    General: Skin is warm and dry.  Neurological:     General: No focal deficit present.     Mental Status: She is alert and oriented to person, place, and time.  Psychiatric:        Mood and Affect: Mood normal.        Behavior: Behavior normal.   EFM: 140 bpm, mod variability, + accels, rare variable decels Toco: 3-5 SVE: 5/80/-2, vtx  MAU Course  Procedures  MDM Labs ordered and reviewed. SROM confirmed and early labor. Assessment and Plan  [redacted] weeks gestation Reactive NST Early labor Admit Mngt per labor team  Video interpreter used for encounter Julianne Handler, [place] 05/26/2022 4:51 AM

## 2022-05-26 NOTE — Progress Notes (Addendum)
Entire encounter performed in presence of a video interpreter: [place]   Called to discussed mode of delivery with patient given her fetus EFW >99% 4626g on 10/17. This is her first baby and other pregnancy issues include AMA, excessive weight gain. Prenatal glucose testing was wnl. Will obtain A1c. Counseled patient about possible shoulder dystocia, arrest of descent, arrest of dilation, and perineal tearing. We also discussed risks of surgery and recovery of c-section as well as the need for c-section due to the above complications shall they arise. She has decided to attempt vaginal delivery at this time.   Gerlene Fee, DO OB Fellow, [place] for [place] 05/26/2022, 6:13 AM

## 2022-05-26 NOTE — Progress Notes (Signed)
Labor Progress Note Dawn Little is a 36 y.o. G1P0000 at [redacted]w[redacted]d presented for SROM 0230  S: Pt feeling her contractions and pressure. Desired epidural  O:  BP 106/62   Pulse (!) 121   Temp 97.7 F (36.5 C) (Oral)   Resp 16   Ht 5' (1.524 m)   Wt 98.4 kg   LMP 09/03/2021   SpO2 100%   BMI 42.36 kg/m  EFM: 150bpm/Moderate variability/ 15x15 accels/ Variable decels   CVE: Dilation: 5 Effacement (%): 90 Station: -1 Presentation: Vertex Exam by:: Davis,RN   A&P: 36 y.o. G1P0000 [redacted]w[redacted]d here with SROM #Labor: Progressing well.  #Pain: Epidural requested #FWB: CAT 1 #GBS negative  Dawn Conrad Mercado-Ortiz, DO 3:54 PM

## 2022-05-26 NOTE — Progress Notes (Signed)
Labor Progress Note Dawn Little is a 36 y.o. G1P0000 at [redacted]w[redacted]d presented for SROM 0230  S: No acute concerns.   O:  BP 101/60   Pulse (!) 126   Temp 99.5 F (37.5 C) (Oral)   Resp 16   Ht 5' (1.524 m)   Wt 98.4 kg   LMP 09/03/2021   SpO2 100%   BMI 42.36 kg/m  EFM: 150bpm/Moderate variability/ 15x15 accels/ Variable decels   CVE: Dilation: Lip/rim Effacement (%): 90 Station: -2, -1 Presentation: Vertex Exam by:: Tommie Raymond, RN   A&P: 36 y.o. G1P0000 [redacted]w[redacted]d here with SROM #Labor: Expectant management. More cervix reported to be on maternal right. Position changes to encourage increased dilation.  #Pain: Epidural #FWB: Cat I #GBS negative  Lomax Poehler [name], DO 9:24 PM

## 2022-05-26 NOTE — Progress Notes (Signed)
Labor Progress Note Sherece Gambrill is a 36 y.o. G1P0000 at [redacted]w[redacted]d presented for SROM 0230  S: Pt feeling more comfortable. Feels pressure.   O:  BP (!) 126/53   Pulse (!) 103   Temp 99.1 F (37.3 C) (Oral)   Resp 16   Ht 5' (1.524 m)   Wt 98.4 kg   LMP 09/03/2021   SpO2 100%   BMI 42.36 kg/m  EFM: 150bpm/Moderate variability/ 15x15 accels/ Variable decels   CVE: Dilation: Lip/rim Effacement (%): 90 Station: -1 Presentation: Vertex Exam by:: mercado ortiz   A&P: 36 y.o. G1P0000 [redacted]w[redacted]d here with SROM #Labor: Progressing well. IUPC placed 0340 #Pain: Epidural #FWB: CAT 1 #GBS negative  Kiora Hallberg Q Mercado-Ortiz, DO 7:02 PM

## 2022-05-26 NOTE — Progress Notes (Signed)
Labor Progress Note Pailyn Bellevue is a 36 y.o. G1P0000 at [redacted]w[redacted]d presented for SROM 0230  S: No acute concerns.   O:  BP 117/65   Pulse (!) 136   Temp 99.4 F (37.4 C) (Oral)   Resp 16   Ht 5' (1.524 m)   Wt 98.4 kg   LMP 09/03/2021   SpO2 100%   BMI 42.36 kg/m  EFM: 150bpm/Moderate variability/ 15x15 accels/ Variable decels   CVE: Dilation: 10 Effacement (%): 90 Station: -2 Presentation: Vertex Exam by:: [name], MD   A&P: 36 y.o. G1P0000 [redacted]w[redacted]d here with SROM #Labor: Expectant management, high station. Asynclitic presentation w/ coupleting contractions. Feels OA but possible OP given increase room in posterior pelvis.  Plans to do manuevers to drop baby down in pelvis. #Pain: Epidural #FWB: Cat I #GBS negative  Hakiem Malizia [name], DO 11:25 PM

## 2022-05-26 NOTE — Progress Notes (Signed)
Dawn Little is a 36 y.o. G1P0000 at [redacted]w[redacted]d by ultrasound admitted for rupture of membranes @ 0230 with clear fluid. EFW >99%tile 4626g on 10/17  Subjective: Patient contracting well. Per Pt they are "strong" but unchanged in intensity. Partner at bedside and supportive. Introductions exchanged.    Objective: BP 115/60   Pulse 95   Temp 98.8 F (37.1 C) (Oral)   Resp 16   Ht 5' (1.524 m)   Wt 98.4 kg   LMP 09/03/2021   SpO2 100%   BMI 42.36 kg/m  No intake/output data recorded. No intake/output data recorded.  FHT:  FHR: 140 bpm, variability: moderate,  accelerations:  Present,  decelerations:  Present early with contractions  UC:   regular, every 2-4 minutes SVE:   Dilation: 5 Effacement (%): 80 Exam by:: Julianne Handler, CNM  Patient defers vaginal exam at this time. Through shared decision making CNM agrees to plan of care.   Labs: Lab Results  Component Value Date   WBC 10.2 05/26/2022   HGB 12.1 05/26/2022   HCT 36.7 05/26/2022   MCV 93.1 05/26/2022   PLT 308 05/26/2022    Assessment / Plan: Spontaneous labor, progressing normally.   Labor: Progressing normally. Initiate pit of contractions space out.  Fetal Wellbeing:  Category II- Continue to monitor for signs of fetal distress.  Pain Control:  Labor support without medications I/D:   GBS Neg  Anticipated MOD:  NSVD  Jacquiline Doe, CNM 05/26/2022, 9:52 AM

## 2022-05-26 NOTE — H&P (Signed)
OBSTETRIC ADMISSION HISTORY AND PHYSICAL  Dawn Little is a 36 y.o. female G1P0000 with IUP at [redacted]w[redacted]d by LMP presenting for SOL/SROM. She reports +FMs, no VB, no blurry vision, headaches or peripheral edema, and RUQ pain.  She plans on breast feeding. She is unsure of what she wants for birth control.  She received her prenatal care at  [place]: By 10 week [place] --->  Estimated Date of Delivery: 05/30/22  Sono:    @[redacted]w[redacted]d , CWD, normal anatomy, cephalic presentation, anterior placental lie, 4626g, >99% EFW   Prenatal History/Complications:  -LGA -AMA -Excessive weight gain -Language barrier (Spanish speaking)  Past Medical History: History reviewed. No pertinent past medical history.  Past Surgical History: Past Surgical History:  Procedure Laterality Date   NO PAST SURGERIES      Obstetrical History: OB History     Gravida  1   Para  0   Term  0   Preterm  0   AB  0   Living  0      SAB  0   IAB  0   Ectopic  0   Multiple  0   Live Births  0           Social History Social History   Socioeconomic History   Marital status: Unknown    Spouse name: Not on file   Number of children: Not on file   Years of education: Not on file   Highest education level: Some college, no degree  Occupational History   Not on file  Tobacco Use   Smoking status: Never    Passive exposure: Never   Smokeless tobacco: Never  Vaping Use   Vaping Use: Never used  Substance and Sexual Activity   Alcohol use: Not Currently    Comment: not while preg   Drug use: Never   Sexual activity: Yes    Birth control/protection: None  Other Topics Concern   Not on file  Social History Narrative   Not on file   Social Determinants of Health   Financial Resource Strain: Low Risk  (04/04/2022)   Overall Financial Resource Strain (CARDIA)    Difficulty of Paying Living Expenses: Not very hard  Food Insecurity: Food Insecurity Present (05/12/2022)    Hunger Vital Sign    Worried About Running Out of Food in the Last Year: Sometimes true    Ran Out of Food in the Last Year: Sometimes true  Transportation Needs: No Transportation Needs (05/12/2022)   PRAPARE - Hydrologist (Medical): No    Lack of Transportation (Non-Medical): No  Physical Activity: Insufficiently Active (04/04/2022)   Exercise Vital Sign    Days of Exercise per Week: 3 days    Minutes of Exercise per Session: 20 min  Stress: Not on file  Social Connections: Moderately Integrated (04/04/2022)   Social Connection and Isolation Panel [NHANES]    Frequency of Communication with Friends and Family: More than three times a week    Frequency of Social Gatherings with Friends and Family: Three times a week    Attends Religious Services: 1 to 4 times per year    Active Member of Clubs or Organizations: No    Attends Music therapist: Not on file    Marital Status: Living with partner    Family History: Family History  Problem Relation Age of Onset   Asthma Neg Hx    Cancer Neg Hx  Diabetes Neg Hx    Heart disease Neg Hx    Hypertension Neg Hx     Allergies: No Known Allergies  Medications Prior to Admission  Medication Sig Dispense Refill Last Dose   Prenatal Vit-Fe Fumarate-FA (MULTIVITAMIN-PRENATAL) 27-0.8 MG TABS tablet Take 1 tablet by mouth daily at 12 noon.   05/25/2022     Review of Systems   All systems reviewed and negative except as stated in HPI  Blood pressure (!) 111/58, pulse 93, temperature 98.3 F (36.8 C), temperature source Oral, resp. rate 17, height 5' 0.63" (1.54 m), weight 98.4 kg, last menstrual period 09/03/2021, SpO2 100 %. General appearance: alert and mild distress Lungs: normal effort Heart: regular rate and rhythm Abdomen: soft, non-tender; gravid Pelvic: deferred Extremities: Homans sign is negative, no sign of DVT Presentation: cephalic via leopolds and US Fetal monitoringBaseline:  125 bpm, Variability: Good {> 6 bpm), Accelerations: Reactive, and Decelerations: Absent Uterine activityFrequency: Every 2-3 minutes Dilation: 5 Effacement (%): 80 Exam by:: Donette Larry, CNM   Prenatal labs: ABO, Rh: --/--/PENDING (10/19 0543) Antibody: PENDING (10/19 0543) Rubella: 10.20 (05/11 0936) RPR: Non Reactive (08/04 0848)  HBsAg: Negative (05/11 0936)  HIV: Non Reactive (08/04 0848)  GBS: Negative/-- (09/28 1327)  1 hr Glucola 130 Genetic screening  LR Anatomy US >99%EFW  Prenatal Transfer Tool  Maternal Diabetes: No Genetic Screening: Normal Maternal Ultrasounds/Referrals: Other: LGA Fetal Ultrasounds or other Referrals:  None Maternal Substance Abuse:  No Significant Maternal Medications:  None Significant Maternal Lab Results:  Group B Strep negative Number of Prenatal Visits:greater than 3 verified prenatal visits Other Comments:  None  Results for orders placed or performed during the hospital encounter of 05/26/22 (from the past 24 hour(s))  Type and screen [facility]   Collection Time: 05/26/22  5:43 AM  Result Value Ref Range   ABO/RH(D) PENDING    Antibody Screen PENDING    Sample Expiration      05/29/2022,2359 Performed at [facility], [address], [place], [place] [address]   CBC   Collection Time: 05/26/22  5:44 AM  Result Value Ref Range   WBC 10.2 4.0 - 10.5 K/uL   RBC 3.94 3.87 - 5.11 MIL/uL   Hemoglobin 12.1 12.0 - 15.0 g/dL   HCT 46.2 70.3 - 50.0 %   MCV 93.1 80.0 - 100.0 fL   MCH 30.7 26.0 - 34.0 pg   MCHC 33.0 30.0 - 36.0 g/dL   RDW 93.8 18.2 - 99.3 %   Platelets 308 150 - 400 K/uL   nRBC 0.0 0.0 - 0.2 %    Patient Active Problem List   Diagnosis Date Noted   Indication for care in labor and delivery, antepartum 05/26/2022   Language barrier 05/05/2022   LGA (large for gestational age) fetus affecting management of mother 04/27/2022   Supervision of other normal pregnancy, antepartum 11/23/2021     Assessment/Plan:  Dawn Little is a 36 y.o. G1P0000 at [redacted]w[redacted]d here for SOL/SROM.   #Labor: Manage expectantly. EFW >99% 4626g on 10/17  #Pain: Maternally supported, possible epidural #FWB: Cat I #ID:  GBS neg #MOF: Breast #MOC: Unsure #Circ:  Undecided  Dawn Suriano [name], DO  05/26/2022, 6:34 AM

## 2022-05-26 NOTE — Anesthesia Preprocedure Evaluation (Signed)
Anesthesia Evaluation  Patient identified by MRN, date of birth, ID band Patient awake    Reviewed: Allergy & Precautions, Patient's Chart, lab work & pertinent test results  Airway Mallampati: III  TM Distance: >3 FB Neck ROM: Full    Dental no notable dental hx.    Pulmonary neg pulmonary ROS,    Pulmonary exam normal breath sounds clear to auscultation       Cardiovascular negative cardio ROS Normal cardiovascular exam Rhythm:Regular Rate:Normal     Neuro/Psych negative neurological ROS  negative psych ROS   GI/Hepatic negative GI ROS, Neg liver ROS,   Endo/Other  Morbid obesityBMI 42  Renal/GU negative Renal ROS  negative genitourinary   Musculoskeletal negative musculoskeletal ROS (+)   Abdominal (+) + obese,   Peds negative pediatric ROS (+)  Hematology negative hematology ROS (+) Hb 12.2, plt 308   Anesthesia Other Findings   Reproductive/Obstetrics (+) Pregnancy                             Anesthesia Physical Anesthesia Plan  ASA: 3  Anesthesia Plan: Epidural   Post-op Pain Management:    Induction:   PONV Risk Score and Plan: 2  Airway Management Planned: Natural Airway  Additional Equipment: None  Intra-op Plan:   Post-operative Plan:   Informed Consent: I have reviewed the patients History and Physical, chart, labs and discussed the procedure including the risks, benefits and alternatives for the proposed anesthesia with the patient or authorized representative who has indicated his/her understanding and acceptance.     Interpreter used for [employer] Discussed with:   Anesthesia Plan Comments:         Anesthesia Quick Evaluation

## 2022-05-26 NOTE — Progress Notes (Signed)
Dawn Little is a 36 y.o. G1P0000 at [redacted]w[redacted]d by ultrasound admitted for rupture of membranes on 10/19 @ 0230am. Clear fluid   Subjective: Patient feeling "more uncomfortable with contractions" and desires a cervical exam and pain medication   Objective: BP 124/71   Pulse 93   Temp 98.8 F (37.1 C) (Oral)   Resp 16   Ht 5' (1.524 m)   Wt 98.4 kg   LMP 09/03/2021   SpO2 100%   BMI 42.36 kg/m  No intake/output data recorded. No intake/output data recorded.  FHT:  FHR: 135 bpm, variability: moderate,  accelerations:  Present,  decelerations:  Present early decels with contractions UC:   regular, every 4 minutes SVE:   Dilation: 5 Effacement (%): 80 Station: -1, 0 Exam by:: Aedon Deason,CNM  Labs: Lab Results  Component Value Date   WBC 10.2 05/26/2022   HGB 12.1 05/26/2022   HCT 36.7 05/26/2022   MCV 93.1 05/26/2022   PLT 308 05/26/2022    Assessment / Plan: Spontaneous labor, with SROM, making minimal cervical change.  FHT cat II and reassuring.   Labor:  Minimal progression. CNM encouraged patient to get up and walk as patient desires a low intervention birth. Plan to initiate pit 2x2 if contractions space out.   Fetal Wellbeing:  Category II- Continue to monitor for signs of fetal distress.  Pain Control:  Labor support without medications I/D:   GBS neg  Anticipated MOD:  NSVD  Jacquiline Doe, CNM 05/26/2022, 11:06 AM

## 2022-05-27 ENCOUNTER — Encounter (HOSPITAL_COMMUNITY)
Admission: AD | Disposition: A | Discharge status: Home / Self Care | Payer: Self-pay | Source: Home / Self Care | Attending: Obstetrics & Gynecology

## 2022-05-27 ENCOUNTER — Encounter (HOSPITAL_COMMUNITY): Payer: Self-pay | Admitting: Obstetrics and Gynecology

## 2022-05-27 DIAGNOSIS — O411230 Chorioamnionitis, third trimester, not applicable or unspecified: Secondary | ICD-10-CM

## 2022-05-27 DIAGNOSIS — O3663X0 Maternal care for excessive fetal growth, third trimester, not applicable or unspecified: Secondary | ICD-10-CM

## 2022-05-27 DIAGNOSIS — O99214 Obesity complicating childbirth: Secondary | ICD-10-CM

## 2022-05-27 DIAGNOSIS — Z3A39 39 weeks gestation of pregnancy: Secondary | ICD-10-CM

## 2022-05-27 DIAGNOSIS — O09523 Supervision of elderly multigravida, third trimester: Secondary | ICD-10-CM

## 2022-05-27 DIAGNOSIS — O4202 Full-term premature rupture of membranes, onset of labor within 24 hours of rupture: Secondary | ICD-10-CM

## 2022-05-27 SURGERY — Surgical Case
Anesthesia: Epidural

## 2022-05-27 MED ORDER — FERROUS SULFATE 325 (65 FE) MG PO TABS
325.0000 mg | ORAL_TABLET | ORAL | Status: DC
  Administered 2022-05-28 – 2022-05-30 (×3): 325 mg via ORAL
  Filled 2022-05-27 (×3): qty 1

## 2022-05-27 MED ORDER — SODIUM CHLORIDE 0.9% FLUSH: 3.0000 mL | INTRAVENOUS | Status: DC | PRN

## 2022-05-27 MED ORDER — KETOROLAC TROMETHAMINE 30 MG/ML IJ SOLN: 30.0000 mg | Freq: Four times a day (QID) | INTRAMUSCULAR | Status: AC | PRN

## 2022-05-27 MED ORDER — OXYCODONE-ACETAMINOPHEN 5-325 MG PO TABS: 2.0000 | ORAL_TABLET | ORAL | Status: DC | PRN

## 2022-05-27 MED ORDER — ONDANSETRON HCL 4 MG/2ML IJ SOLN
INTRAMUSCULAR | Status: AC
  Filled 2022-05-27: qty 2

## 2022-05-27 MED ORDER — LIDOCAINE-EPINEPHRINE (PF) 2 %-1:200000 IJ SOLN
INTRAMUSCULAR | Status: DC | PRN
  Administered 2022-05-27: 4 mL via EPIDURAL
  Administered 2022-05-27: 10 mL via EPIDURAL

## 2022-05-27 MED ORDER — TETANUS-DIPHTH-ACELL PERTUSSIS 5-2.5-18.5 LF-MCG/0.5 IM SUSY: 0.5000 mL | PREFILLED_SYRINGE | Freq: Once | INTRAMUSCULAR | Status: DC

## 2022-05-27 MED ORDER — FENTANYL CITRATE (PF) 100 MCG/2ML IJ SOLN
INTRAMUSCULAR | Status: AC
  Filled 2022-05-27: qty 2

## 2022-05-27 MED ORDER — MORPHINE SULFATE (PF) 0.5 MG/ML IJ SOLN
INTRAMUSCULAR | Status: DC | PRN
  Administered 2022-05-27: 3 mg via EPIDURAL

## 2022-05-27 MED ORDER — PHENYLEPHRINE HCL-NACL 20-0.9 MG/250ML-% IV SOLN: INTRAVENOUS | Status: DC | PRN

## 2022-05-27 MED ORDER — STERILE WATER FOR IRRIGATION IR SOLN
Status: DC | PRN
  Administered 2022-05-27: 1000 mL

## 2022-05-27 MED ORDER — LACTATED RINGERS IV SOLN: INTRAVENOUS | Status: DC

## 2022-05-27 MED ORDER — PIPERACILLIN-TAZOBACTAM 3.375 G IVPB
3.3750 g | Freq: Three times a day (TID) | INTRAVENOUS | Status: AC
  Administered 2022-05-27 – 2022-05-28 (×2): 3.375 g via INTRAVENOUS
  Filled 2022-05-27 (×4): qty 50

## 2022-05-27 MED ORDER — TRANEXAMIC ACID-NACL 1000-0.7 MG/100ML-% IV SOLN
1000.0000 mg | INTRAVENOUS | Status: AC
  Administered 2022-05-27: 1000 mg via INTRAVENOUS

## 2022-05-27 MED ORDER — COCONUT OIL OIL: 1.0000 | TOPICAL_OIL | Status: DC | PRN

## 2022-05-27 MED ORDER — MENTHOL 3 MG MT LOZG: 1.0000 | LOZENGE | OROMUCOSAL | Status: DC | PRN

## 2022-05-27 MED ORDER — WITCH HAZEL-GLYCERIN EX PADS: 1.0000 | MEDICATED_PAD | CUTANEOUS | Status: DC | PRN

## 2022-05-27 MED ORDER — KETOROLAC TROMETHAMINE 30 MG/ML IJ SOLN
30.0000 mg | Freq: Four times a day (QID) | INTRAMUSCULAR | Status: AC
  Administered 2022-05-27 – 2022-05-28 (×4): 30 mg via INTRAVENOUS
  Filled 2022-05-27 (×4): qty 1

## 2022-05-27 MED ORDER — ACETAMINOPHEN 500 MG PO TABS
1000.0000 mg | ORAL_TABLET | Freq: Four times a day (QID) | ORAL | Status: DC
  Filled 2022-05-27 (×2): qty 2

## 2022-05-27 MED ORDER — ZOLPIDEM TARTRATE 5 MG PO TABS: 5.0000 mg | ORAL_TABLET | Freq: Every evening | ORAL | Status: DC | PRN

## 2022-05-27 MED ORDER — DIBUCAINE (PERIANAL) 1 % EX OINT: 1.0000 | TOPICAL_OINTMENT | CUTANEOUS | Status: DC | PRN

## 2022-05-27 MED ORDER — IBUPROFEN 600 MG PO TABS
600.0000 mg | ORAL_TABLET | Freq: Four times a day (QID) | ORAL | Status: DC
  Administered 2022-05-28 – 2022-05-30 (×10): 600 mg via ORAL
  Filled 2022-05-27 (×10): qty 1

## 2022-05-27 MED ORDER — GABAPENTIN 100 MG PO CAPS
300.0000 mg | ORAL_CAPSULE | Freq: Two times a day (BID) | ORAL | Status: DC
  Administered 2022-05-27 – 2022-05-30 (×7): 300 mg via ORAL
  Filled 2022-05-27 (×7): qty 3

## 2022-05-27 MED ORDER — ACETAMINOPHEN 500 MG PO TABS
1000.0000 mg | ORAL_TABLET | Freq: Four times a day (QID) | ORAL | Status: DC
  Administered 2022-05-27 – 2022-05-30 (×12): 1000 mg via ORAL
  Filled 2022-05-27 (×12): qty 2

## 2022-05-27 MED ORDER — SODIUM CHLORIDE 0.9 % IV SOLN
500.0000 mg | INTRAVENOUS | Status: AC
  Administered 2022-05-27: 500 mg via INTRAVENOUS

## 2022-05-27 MED ORDER — SCOPOLAMINE 1 MG/3DAYS TD PT72
MEDICATED_PATCH | TRANSDERMAL | Status: DC | PRN
  Administered 2022-05-27: 1 via TRANSDERMAL

## 2022-05-27 MED ORDER — OXYTOCIN-SODIUM CHLORIDE 30-0.9 UT/500ML-% IV SOLN
INTRAVENOUS | Status: DC | PRN
  Administered 2022-05-27: 250 mL/h via INTRAVENOUS

## 2022-05-27 MED ORDER — METRONIDAZOLE 500 MG/100ML IV SOLN
500.0000 mg | INTRAVENOUS | Status: AC
  Administered 2022-05-27: 500 mg via INTRAVENOUS
  Filled 2022-05-27: qty 100

## 2022-05-27 MED ORDER — DIPHENHYDRAMINE HCL 25 MG PO CAPS: 25.0000 mg | ORAL_CAPSULE | Freq: Four times a day (QID) | ORAL | Status: DC | PRN

## 2022-05-27 MED ORDER — ONDANSETRON HCL 4 MG/2ML IJ SOLN: 4.0000 mg | Freq: Three times a day (TID) | INTRAMUSCULAR | Status: DC | PRN

## 2022-05-27 MED ORDER — FENTANYL CITRATE (PF) 100 MCG/2ML IJ SOLN
25.0000 ug | INTRAMUSCULAR | Status: DC | PRN
  Administered 2022-05-27 (×2): 50 ug via INTRAVENOUS

## 2022-05-27 MED ORDER — PHENYLEPHRINE 80 MCG/ML (10ML) SYRINGE FOR IV PUSH (FOR BLOOD PRESSURE SUPPORT)
PREFILLED_SYRINGE | INTRAVENOUS | Status: DC | PRN
  Administered 2022-05-27 (×6): 160 ug via INTRAVENOUS

## 2022-05-27 MED ORDER — PHENYLEPHRINE 80 MCG/ML (10ML) SYRINGE FOR IV PUSH (FOR BLOOD PRESSURE SUPPORT)
PREFILLED_SYRINGE | INTRAVENOUS | Status: AC
  Filled 2022-05-27: qty 10

## 2022-05-27 MED ORDER — NALOXONE HCL 0.4 MG/ML IJ SOLN: 0.4000 mg | INTRAMUSCULAR | Status: DC | PRN

## 2022-05-27 MED ORDER — OXYTOCIN-SODIUM CHLORIDE 30-0.9 UT/500ML-% IV SOLN: 2.5000 [IU]/h | INTRAVENOUS | Status: AC

## 2022-05-27 MED ORDER — DEXAMETHASONE SODIUM PHOSPHATE 10 MG/ML IJ SOLN
INTRAMUSCULAR | Status: AC
  Filled 2022-05-27: qty 1

## 2022-05-27 MED ORDER — SENNOSIDES-DOCUSATE SODIUM 8.6-50 MG PO TABS
2.0000 | ORAL_TABLET | Freq: Every day | ORAL | Status: DC
  Administered 2022-05-28 – 2022-05-30 (×3): 2 via ORAL
  Filled 2022-05-27 (×3): qty 2

## 2022-05-27 MED ORDER — MEASLES, MUMPS & RUBELLA VAC IJ SOLR: 0.5000 mL | Freq: Once | INTRAMUSCULAR | Status: DC

## 2022-05-27 MED ORDER — DIPHENHYDRAMINE HCL 50 MG/ML IJ SOLN: 12.5000 mg | INTRAMUSCULAR | Status: DC | PRN

## 2022-05-27 MED ORDER — MORPHINE SULFATE (PF) 0.5 MG/ML IJ SOLN
INTRAMUSCULAR | Status: AC
  Filled 2022-05-27: qty 10

## 2022-05-27 MED ORDER — DEXAMETHASONE SODIUM PHOSPHATE 10 MG/ML IJ SOLN
INTRAMUSCULAR | Status: DC | PRN
  Administered 2022-05-27: 4 mg via INTRAVENOUS

## 2022-05-27 MED ORDER — NALOXONE HCL 4 MG/10ML IJ SOLN: 1.0000 ug/kg/h | INTRAVENOUS | Status: DC | PRN

## 2022-05-27 MED ORDER — KETOROLAC TROMETHAMINE 30 MG/ML IJ SOLN: 30.0000 mg | Freq: Once | INTRAMUSCULAR | Status: AC | PRN

## 2022-05-27 MED ORDER — SCOPOLAMINE 1 MG/3DAYS TD PT72: 1.0000 | MEDICATED_PATCH | Freq: Once | TRANSDERMAL | Status: DC

## 2022-05-27 MED ORDER — SIMETHICONE 80 MG PO CHEW
80.0000 mg | CHEWABLE_TABLET | ORAL | Status: DC | PRN
  Administered 2022-05-28: 80 mg via ORAL
  Filled 2022-05-27: qty 1

## 2022-05-27 MED ORDER — MAGNESIUM HYDROXIDE 400 MG/5ML PO SUSP: 30.0000 mL | ORAL | Status: DC | PRN

## 2022-05-27 MED ORDER — DIPHENHYDRAMINE HCL 25 MG PO CAPS: 25.0000 mg | ORAL_CAPSULE | ORAL | Status: DC | PRN

## 2022-05-27 MED ORDER — PRENATAL MULTIVITAMIN CH
1.0000 | ORAL_TABLET | Freq: Every day | ORAL | Status: DC
  Administered 2022-05-27 – 2022-05-30 (×4): 1 via ORAL
  Filled 2022-05-27 (×4): qty 1

## 2022-05-27 MED ORDER — OXYCODONE HCL 5 MG PO TABS
5.0000 mg | ORAL_TABLET | ORAL | Status: DC | PRN
  Administered 2022-05-27 – 2022-05-30 (×7): 10 mg via ORAL
  Filled 2022-05-27 (×7): qty 2

## 2022-05-27 MED ORDER — ONDANSETRON HCL 4 MG/2ML IJ SOLN
INTRAMUSCULAR | Status: DC | PRN
  Administered 2022-05-27: 4 mg via INTRAVENOUS

## 2022-05-27 MED ORDER — HYDROMORPHONE HCL 1 MG/ML IJ SOLN: 1.0000 mg | INTRAMUSCULAR | Status: DC | PRN

## 2022-05-27 MED ORDER — ENOXAPARIN SODIUM 60 MG/0.6ML IJ SOSY
0.5000 mg/kg | PREFILLED_SYRINGE | INTRAMUSCULAR | Status: DC
  Administered 2022-05-27 – 2022-05-29 (×3): 50 mg via SUBCUTANEOUS
  Filled 2022-05-27 (×3): qty 0.6

## 2022-05-27 SURGICAL SUPPLY — 31 items
BENZOIN TINCTURE PRP APPL 2/3 (GAUZE/BANDAGES/DRESSINGS) IMPLANT
CHLORAPREP W/TINT 26ML (MISCELLANEOUS) ×2 IMPLANT
CLAMP UMBILICAL CORD (MISCELLANEOUS) ×1 IMPLANT
CLOTH BEACON ORANGE TIMEOUT ST (SAFETY) ×1 IMPLANT
DRSG OPSITE POSTOP 4X10 (GAUZE/BANDAGES/DRESSINGS) ×1 IMPLANT
ELECT REM PT RETURN 9FT ADLT (ELECTROSURGICAL) ×1
ELECTRODE REM PT RTRN 9FT ADLT (ELECTROSURGICAL) ×1 IMPLANT
EXTRACTOR VACUUM M CUP 4 TUBE (SUCTIONS) IMPLANT
GAUZE SPONGE 4X4 12PLY STRL LF (GAUZE/BANDAGES/DRESSINGS) IMPLANT
GLOVE BIOGEL PI IND STRL 7.0 (GLOVE) ×3 IMPLANT
GLOVE ECLIPSE 7.0 STRL STRAW (GLOVE) ×1 IMPLANT
GOWN STRL REUS W/TWL LRG LVL3 (GOWN DISPOSABLE) ×2 IMPLANT
KIT ABG SYR 3ML LUER SLIP (SYRINGE) IMPLANT
NDL HYPO 25X5/8 SAFETYGLIDE (NEEDLE) ×1 IMPLANT
NEEDLE HYPO 22GX1.5 SAFETY (NEEDLE) ×1 IMPLANT
NEEDLE HYPO 25X5/8 SAFETYGLIDE (NEEDLE) ×1 IMPLANT
NS IRRIG 1000ML POUR BTL (IV SOLUTION) ×1 IMPLANT
PACK C SECTION WH (CUSTOM PROCEDURE TRAY) ×1 IMPLANT
PAD ABD 7.5X8 STRL (GAUZE/BANDAGES/DRESSINGS) ×1 IMPLANT
PAD OB MATERNITY 4.3X12.25 (PERSONAL CARE ITEMS) ×1 IMPLANT
RTRCTR C-SECT PINK 25CM LRG (MISCELLANEOUS) IMPLANT
STRIP CLOSURE SKIN 1/2X4 (GAUZE/BANDAGES/DRESSINGS) IMPLANT
SUT PDS AB 0 CTX 36 PDP370T (SUTURE) IMPLANT
SUT PLAIN 2 0 XLH (SUTURE) IMPLANT
SUT VIC AB 0 CTX 36 (SUTURE) ×2
SUT VIC AB 0 CTX36XBRD ANBCTRL (SUTURE) ×2 IMPLANT
SUT VIC AB 4-0 KS 27 (SUTURE) ×1 IMPLANT
SYR CONTROL 10ML LL (SYRINGE) ×1 IMPLANT
TOWEL OR 17X24 6PK STRL BLUE (TOWEL DISPOSABLE) ×1 IMPLANT
TRAY FOLEY W/BAG SLVR 14FR LF (SET/KITS/TRAYS/PACK) ×1 IMPLANT
WATER STERILE IRR 1000ML POUR (IV SOLUTION) ×1 IMPLANT

## 2022-05-27 NOTE — Progress Notes (Addendum)
Labor Progress Note Dawn Little is a 36 y.o. G1P0000 at [redacted]w[redacted]d who presented for SROM 0230  AMN interpreter used during this encounter.  S: Comfortable in bed, has been through several re-positioning manuevers  O:  BP 124/80   Pulse (!) 125   Temp 99.4 F (37.4 C) (Oral)   Resp 16   Ht 5' (1.524 m)   Wt 98.4 kg   LMP 09/03/2021   SpO2 100%   BMI 42.36 kg/m  EFM: 150bpm/Moderate variability/ 15x15 accels/ Variable decels  CVE: Dilation: 10 Dilation Complete Date: 05/26/22 Dilation Complete Time: 2308 Effacement (%): 100 Station: -2 Presentation: Vertex Exam by:: [name], MD Repeat exam done by myself, fetal head noted to be asynclitic  A&P: 36 y.o. G1P0000 [redacted]w[redacted]d here with SROM No change in descent on examination and with pushing. This is consistent with arrest of descent. Also concerned about asynclitic head.  No response after several maneuvers over the past two hours.  Patient receiving treatment for intrauterine infection, has received Ampicillin and Gentamicin is going in now.  Overall reassuring FHR tracing with some periods of fetal tachycardia noted.  Reviewed concern about arrest of descent with patient.  Recommended cesarean delivery which patient agreed to, also discussed alternative of continued pushing which the patient declined.  The risks of surgery were discussed with the patient including but were not limited to: bleeding which may require transfusion or reoperation; infection which may require antibiotics; injury to bowel, bladder, ureters or other surrounding organs; injury to the fetus; need for additional procedures including hysterectomy in the event of a life-threatening hemorrhage; formation of adhesions; placental abnormalities with subsequent pregnancies; incisional problems; thromboembolic phenomenon and other postoperative/anesthesia complications.  The patient concurred with the proposed plan, giving informed written consent for the  procedure.   Anesthesia and OR aware. Preoperative prophylactic antibiotics (Metronidazole and Azithromycin) and SCDs ordered on call to the OR.  To OR when ready.   Verita Schneiders, MD 1:15 AM

## 2022-05-27 NOTE — Anesthesia Postprocedure Evaluation (Signed)
Anesthesia Post Note  Patient: Dawn Little  Procedure(s) Performed: [place]     Patient location during evaluation: PACU Anesthesia Type: Epidural Level of consciousness: oriented and awake and alert Pain management: pain level controlled Vital Signs Assessment: post-procedure vital signs reviewed and stable Respiratory status: spontaneous breathing, respiratory function stable and patient connected to nasal cannula oxygen Cardiovascular status: blood pressure returned to baseline and stable Postop Assessment: no headache, no backache, no apparent nausea or vomiting and epidural receding Anesthetic complications: no   No notable events documented.  Last Vitals:  Vitals:   05/27/22 0416 05/27/22 0419  BP:    Pulse: 100 (!) 104  Resp: 15 19  Temp:    SpO2: 95% 93%    Last Pain:  Vitals:   05/27/22 0415  TempSrc:   PainSc: 6    Pain Goal: Patients Stated Pain Goal: 0 (05/26/22 0401)  LLE Motor Response: Purposeful movement (05/27/22 0401) LLE Sensation: Tingling (05/27/22 0401) RLE Motor Response: Purposeful movement (05/27/22 0401) RLE Sensation: Tingling (05/27/22 0401)     Epidural/Spinal Function Cutaneous sensation: Tingles (05/27/22 0401), Patient able to flex knees: Yes (05/27/22 0401), Patient able to lift hips off bed: Yes (05/27/22 0401), Back pain beyond tenderness at insertion site: No (05/27/22 0401), Progressively worsening motor and/or sensory loss: No (05/27/22 0401), Bowel and/or bladder incontinence post epidural: No (05/27/22 0401)  Angelissa Supan L Keely Drennan

## 2022-05-27 NOTE — Op Note (Signed)
Dawn Little PROCEDURE DATE: 05/27/2022  PREOPERATIVE DIAGNOSES: Intrauterine pregnancy at [redacted]w[redacted]d weeks gestation; failure to progress: arrest of descent; asynclitic fetal head on examination; intrauterine infection  POSTOPERATIVE DIAGNOSES: The same  PROCEDURE: Low Transverse Cesarean Section  SURGEON:  Dr. Verita Schneiders  ASSISTANT:  Dr. Naaman Plummer [name]. An experienced assistant was required given the standard of surgical care given the complexity of the case.  This assistant was needed for exposure, dissection, suctioning, retraction, instrument exchange, assisting with delivery with administration of fundal pressure, and for overall help during the procedure.  ANESTHESIOLOGY TEAM: Anesthesiologist: Pervis Hocking, DO; Freddrick March, MD  INDICATIONS: Dawn Little is a 36 y.o. G1P1001 at [redacted]w[redacted]d here for cesarean section secondary to the indications listed under preoperative diagnoses; please see preoperative note for further details.  The risks of surgery were discussed with the patient including but were not limited to: bleeding which may require transfusion or reoperation; infection which may require antibiotics; injury to bowel, bladder, ureters or other surrounding organs; injury to the fetus; need for additional procedures including hysterectomy in the event of a life-threatening hemorrhage; formation of adhesions; placental abnormalities wth subsequent pregnancies; incisional problems; thromboembolic phenomenon and other postoperative/anesthesia complications.  The patient concurred with the proposed plan, giving informed written consent for the procedure.    FINDINGS:  Viable female infant in cephalic presentation. Asynclitic, direct occiput position.  Nuchal cord x 1. Heavy meconium fluid in uterus.  Apgars 8 and 9.  Intact placenta, three vessel cord.  Normal uterus, fallopian tubes and ovaries bilaterally.  ANESTHESIA: Epidural  ESTIMATED  BLOOD LOSS: 903 ml SPECIMENS: Placenta sent to pathology COMPLICATIONS: None immediate  PROCEDURE IN DETAIL:  The patient preoperatively received intravenous antibiotics and had sequential compression devices applied to her lower extremities.  She was then taken to the operating room where the epidural anesthesia was dosed up to surgical level and was found to be adequate. She was then placed in a dorsal supine position with a leftward tilt, and prepped and draped in a sterile manner.  A foley catheter was placed into her bladder and attached to constant gravity.  After an adequate timeout was performed, a Pfannenstiel skin incision was made with scalpel and carried through to the underlying layer of fascia. The fascia was incised in the midline, and this incision was extended bilaterally in a blunt fashion.  The underlying rectus muscles were dissected off the fascia superiorly and inferiorly in a blunt fashion. The rectus muscles were separated in the midline and the peritoneum was entered bluntly. The Alexis self-retaining retractor was introduced into the abdominal cavity.  Attention was turned to the lower uterine segment where a low transverse hysterotomy was made with a scalpel and extended bilaterally bluntly.  The infant was successfully delivered, the cord was clamped and cut after one minute, and the infant was handed over to the awaiting neonatology team. Uterine massage was then administered, and the placenta delivered intact with a three-vessel cord. The uterus was then cleared of clots and debris.  The hysterotomy was closed with 0 Vicryl in a locked fashion.  One figure-of-eight 0 Vicryl serosal stitch was placed to help with hemostasis.  The pelvis was cleared of all clot and debris. Hemostasis was confirmed on all surfaces.  The retractor was removed.  The peritoneum was closed with a 0 Vicryl running stitch and the rectus muscles was reapproximated using a 0 Vicryl interrupted stitch. The  fascia was then closed using 0 Vicryl  in a running fashion.  The subcutaneous layer was irrigated, reapproximated with 2-0 plain gut interrupted stitches, and the skin was closed with a 4-0 Vicryl subcuticular stitch. The patient tolerated the procedure well. Sponge, instrument and needle counts were correct x 3.  She was taken to the recovery room in stable condition.    Dawn Collins, MD, FACOG Obstetrician & Gynecologist, [facility] for [employer], [facility]

## 2022-05-27 NOTE — Lactation Note (Signed)
This note was copied from a baby's chart. Lactation Consultation Note  Patient Name: Dawn Little Date: 05/27/2022 Age - 8 hours - 2nd [facility] visit  Reason for consult: Follow-up assessment;Primapara;1st time breastfeeding;Term;Breastfeeding assistance;Mother's request (baby already latched when [facility] entered the room. per dad baby latched at 32. Baby in the laid back position. LC noted swallows and per mom comfortable. Baby still feeding.)    Maternal Data Does the patient have breastfeeding experience prior to this delivery?: No  Feeding Mother's Current Feeding Choice: Breast Milk and Formula  LATCH Score Latch:  (latched with depth)  Audible Swallowing:  (swallows noted)     Comfort (Breast/Nipple):  (per mom comfortable)  Hold (Positioning):  (dad assisted to position and latch)      Lactation Tools Discussed/Used    Interventions Interventions: Breast feeding basics reviewed;Skin to skin;Adjust position;Breast compression;Education  Discharge Pump:  ([place] reviewed the stork DEBP - and provided the [place] website so mom and dad can look at the product and decide.)  Consult Status Consult Status: Follow-up Date: 05/28/22 Follow-up type: In-patient    [place] 05/27/2022, 11:14 AM

## 2022-05-27 NOTE — Discharge Summary (Signed)
Postpartum Discharge Summary  Date of Service updated-10/23     Patient Name: Dawn Little DOB: 1985/08/11 MRN: [id number]  Date of admission: 05/26/2022 Delivery date:05/27/2022  Delivering provider: Verita Schneiders A  Date of discharge: 05/30/2022  Admitting diagnosis: Indication for care in labor and delivery, antepartum [O75.9] Intrauterine pregnancy: [redacted]w[redacted]d    Secondary diagnosis:  Principal Problem:   S/P cesarean section for arrest of descent  Additional problems: none    Discharge diagnosis: Term Pregnancy Delivered                                              Post partum procedures: none Augmentation: N/A Complications: Intrauterine Inflammation or infection (Chorioamniotis) and ROM>24 hours  Hospital course: Onset of Labor With Unplanned C/S   36y.o. yo G1P1001 at 369w4das admitted in Latent Labor on 05/26/2022 w/ fetal macrosomia EFW >99% 4626g. Risk of vaginal birth v. C-section discussed prior to admission and the patient wanted to try for a vaginal delivery. Patient had a labor course significant for slow progression. The patient went for cesarean section due to Malpresentation, Macrosomia, and Arrest of Descent. Delivery details as follows: Membrane Rupture Time/Date: 2:30 AM ,05/26/2022   Delivery Method:C-Section, Low Transverse  Details of operation can be found in separate operative note. Patient had a postpartum course that was uncomplicated. She did receive Lasix x 5 days for prophylaxis and was sent home on oral iron. She is ambulating,tolerating a regular diet, passing flatus, and urinating well.  Patient is discharged home in stable condition 05/30/22.  Newborn Data: Birth date:05/27/2022  Birth time:2:17 AM  Gender:Female  Living status:Living  Apgars:8 ,9  Weight:3700 g   Magnesium Sulfate received: No BMZ received: No Rhophylac:No MMWKG:SUPJSRP-DaP: ordered Flu: No Transfusion:No  Physical exam  Vitals:   05/29/22 0500  05/29/22 1400 05/29/22 2045 05/30/22 0512  BP: 119/69 121/76 134/62 119/72  Pulse: 76 81 78   Resp: 16 17 16 15   Temp: 98 F (36.7 C) 97.8 F (36.6 C) 98.4 F (36.9 C)   TempSrc: Oral Oral Oral   SpO2:    95%  Weight:      Height:       General: alert, cooperative, and no distress Lochia: appropriate Uterine Fundus: firm Incision: Dressing is clean, dry, and intact DVT Evaluation: No evidence of DVT seen on physical exam, 1+ edema bilaterally Labs: Lab Results  Component Value Date   WBC 16.3 (H) 05/28/2022   HGB 8.9 (L) 05/28/2022   HCT 26.7 (L) 05/28/2022   MCV 94.0 05/28/2022   PLT 229 05/28/2022      Latest Ref Rng & Units 05/12/2022   11:39 AM  CMP  Glucose 70 - 99 mg/dL 109   BUN 6 - 20 mg/dL 6   Creatinine 0.57 - 1.00 mg/dL 0.56   Sodium 134 - 144 mmol/L 140   Potassium 3.5 - 5.2 mmol/L 4.6   Chloride 96 - 106 mmol/L 106   CO2 20 - 29 mmol/L 16   Calcium 8.7 - 10.2 mg/dL 9.2   Total Protein 6.0 - 8.5 g/dL 6.6   Total Bilirubin 0.0 - 1.2 mg/dL 0.2   Alkaline Phos 44 - 121 IU/L 244   AST 0 - 40 IU/L 19   ALT 0 - 32 IU/L 6    Edinburgh Score:  05/27/2022    4:00 PM  Edinburgh Postnatal Depression Scale Screening Tool  I have been able to laugh and see the funny side of things. 0  I have looked forward with enjoyment to things. 0  I have blamed myself unnecessarily when things went wrong. 0  I have been anxious or worried for no good reason. 0  I have felt scared or panicky for no good reason. 0  Things have been getting on top of me. 0  I have been so unhappy that I have had difficulty sleeping. 0  I have felt sad or miserable. 0  I have been so unhappy that I have been crying. 0  The thought of harming myself has occurred to me. 0  Edinburgh Postnatal Depression Scale Total 0     After visit meds:  Allergies as of 05/30/2022   No Known Allergies      Medication List     TAKE these medications    acetaminophen 325 MG tablet Commonly  known as: Tylenol Take 2 tablets (650 mg total) by mouth every 6 (six) hours as needed.   docusate sodium 100 MG capsule Commonly known as: Colace Take 1 capsule (100 mg total) by mouth 2 (two) times daily as needed for mild constipation.   ferrous sulfate 325 (65 FE) MG tablet Take 1 tablet (325 mg total) by mouth every other day.   furosemide 20 MG tablet Commonly known as: LASIX Take 1 tablet (20 mg total) by mouth daily for 2 days.   ibuprofen 600 MG tablet Commonly known as: ADVIL Take 1 tablet (600 mg total) by mouth every 6 (six) hours.   multivitamin-prenatal 27-0.8 MG Tabs tablet Take 1 tablet by mouth daily at 12 noon.   oxyCODONE 5 MG immediate release tablet Commonly known as: Oxy IR/ROXICODONE Take 1-2 tablets (5-10 mg total) by mouth every 6 (six) hours as needed for up to 7 days for moderate pain or breakthrough pain.         Discharge home in stable condition Infant Feeding: Breast Infant Disposition:home with mother Discharge instruction: per After Visit Summary and Postpartum booklet. Activity: Advance as tolerated. Pelvic rest for 6 weeks.  Diet: routine diet Future Appointments: Future Appointments  Date Time Provider [place]  07/06/2022 11:15 AM Luvenia Redden, PA-C [facility]   Follow up Visit:  [facility]. Go in 1 week(s).   Specialty: Obstetrics and Gynecology Why: Follow up in one week for a check of your incision Contact information: [place] [phone]                Message sent to [facility] by [place] on 05/30/2022  Please schedule this patient for a In person postpartum visit in 6 weeks with the following provider: MD and APP. Additional Postpartum F/U:Incision check 1 week  High risk pregnancy complicated by:  Macrosomia, AMA, obesity Delivery mode:  C-Section, Low Transverse  Anticipated  Birth Control:  Unsure   05/30/2022 Annalee Genta, DO

## 2022-05-27 NOTE — Lactation Note (Signed)
This note was copied from a baby's chart. Lactation Consultation Note  Patient Name: Dawn Little [id number] Date: 05/27/2022 Reason for consult: Initial assessment;Primapara;1st time breastfeeding;Term (Mom limited English and dad speaks [place]. LC reviewed and updated the doc flow sheets. per dad baby has been to the breast several times in the last 2 hours for short 5 mins feedings. Baby asleep. LC enc to call with feeding cues.) Age:36 hours  Maternal Data Does the patient have breastfeeding experience prior to this delivery?: No  Feeding Mother's Current Feeding Choice: Breast Milk and Formula  LATCH Score   Lactation Tools Discussed/Used    Interventions Interventions: Breast feeding basics reviewed;Education;LC Services brochure  Discharge Pump:  ([place] reviewed the stork DEBP - and provided the [place] website so mom and dad can look at the product and decide.)  Consult Status Consult Status: Follow-up Date: 05/27/22 Follow-up type: In-patient    [place] 05/27/2022, 10:24 AM

## 2022-05-27 NOTE — Progress Notes (Signed)
Pharmacy Antibiotic Note  Dawn Little is a 36 y.o. female admitted on 05/26/2022 for SROM. Pt now with presumed chorioamnionitis.  Pharmacy has been consulted for gentamicin dosing.  Plan: Gentamicin 5mg /kg q24  Height: 5' (152.4 cm) Weight: 98.4 kg (216 lb 14.4 oz) IBW/kg (Calculated) : 45.5  Temp (24hrs), Avg:99 F (37.2 C), Min:97.7 F (36.5 C), Max:100.2 F (37.9 C)  Recent Labs  Lab 05/26/22 0544  WBC 10.2    Estimated Creatinine Clearance: 103.4 mL/min (A) (by C-G formula based on SCr of 0.56 mg/dL (L)).    No Known Allergies  Antimicrobials this admission: Ampicillin 10/19  >>   Thank you for allowing pharmacy to be a part of this patient's care.  Nyra Capes 05/27/2022 5:56 AM

## 2022-05-27 NOTE — Transfer of Care (Signed)
Immediate Anesthesia Transfer of Care Note  Patient: Dawn Little  Procedure(s) Performed: CESAREAN SECTION  Patient Location: PACU  Anesthesia Type:Regional and Epidural  Level of Consciousness: awake, alert , oriented, and patient cooperative  Airway & Oxygen Therapy: Patient Spontanous Breathing  Post-op Assessment: Report given to RN and Post -op Vital signs reviewed and stable  Post vital signs: Reviewed and stable  Last Vitals:  Vitals Value Taken Time  BP 109/66 05/27/22 0315  Temp    Pulse 112 05/27/22 0315  Resp 22 05/27/22 0315  SpO2 95 % 05/27/22 0315  Vitals shown include unvalidated device data.  Last Pain:  Vitals:   05/26/22 2306  TempSrc: Oral  PainSc:       Patients Stated Pain Goal: 0 (96/75/91 6384)  Complications: No notable events documented.

## 2022-05-27 NOTE — Progress Notes (Signed)
Patient complained of lightheadedness when standing. Significant amount of blood noted upon standing, bleeding controlled and lightheadedness subsided upon sitting. Care ongoing.

## 2022-05-28 LAB — CBC
HCT: 26.7 % — ABNORMAL LOW (ref 36.0–46.0)
Hemoglobin: 8.9 g/dL — ABNORMAL LOW (ref 12.0–15.0)
MCH: 31.3 pg (ref 26.0–34.0)
MCHC: 33.3 g/dL (ref 30.0–36.0)
MCV: 94 fL (ref 80.0–100.0)
Platelets: 229 10*3/uL (ref 150–400)
RBC: 2.84 MIL/uL — ABNORMAL LOW (ref 3.87–5.11)
RDW: 14.6 % (ref 11.5–15.5)
WBC: 16.3 10*3/uL — ABNORMAL HIGH (ref 4.0–10.5)
nRBC: 0 % (ref 0.0–0.2)

## 2022-05-28 NOTE — Progress Notes (Signed)
POSTPARTUM PROGRESS NOTE  POD #1  Subjective:  Dawn Little is a 36 y.o. G1P1001 s/p PLTCS at [redacted]w[redacted]d.  She reports she doing well. No acute events overnight. She reports she is doing well. She denies any problems with ambulating, voiding or po intake. Denies nausea or vomiting. She has passed flatus. Pain is well controlled.  Lochia is minimal.  Objective: Blood pressure 96/60, pulse 88, temperature 98.4 F (36.9 C), temperature source Oral, resp. rate 18, height 5' (1.524 m), weight 98.4 kg, last menstrual period 09/03/2021, SpO2 98 %, unknown if currently breastfeeding.  Physical Exam:  General: alert, cooperative and no distress Chest: no respiratory distress Heart:regular rate, distal pulses intact Abdomen: soft, nontender,  Uterine Fundus: firm, appropriately tender DVT Evaluation: No calf swelling or tenderness Extremities: No LE edema Skin: warm, dry; incision clean/dry/intact w/ honeycomb dressing in place  Recent Labs    05/26/22 0544 05/28/22 0513  HGB 12.1 8.9*  HCT 36.7 26.7*    Assessment/Plan: Dawn Little is a 36 y.o. G1P1001 s/p pLTCS at [redacted]w[redacted]d for AOD.  POD#1 - Doing welll; pain well controlled. H/H appropriate  Routine postpartum care  OOB, ambulated  Lovenox for VTE prophylaxis Anemia: asymptomatic Contraception: Decline Feeding: Breast  Dispo: Plan for discharge 10/23.   LOS: 2 days   Gerlene Fee, DO OB Fellow, [place] for [place] 05/28/2022, 9:35 AM

## 2022-05-28 NOTE — Lactation Note (Signed)
This note was copied from a baby's chart. Lactation Consultation Note  Patient Name: Dawn Little [id number] Date: 05/28/2022 Reason for consult: Follow-up assessment;Mother's request;Infant weight loss;Breastfeeding assistance (3.38% WL) Age:35 hours  LC entered the room and the infant was using a bili blanket while being held by the birth parent.  Per the birth parent, things are going well with breastfeeding and pumping.  The birth parent stated that she is not seeing a lot of milk when pumping.  LC encouraged the birth parent and informed her about milk production in the first few days of life.  The birth parent wanted to know if her milk production was being affected by the medication.  [place] spoke with the birth parent about supply and demand and encouraged her to keep stimulating her breasts. The birth parent stated that she had no further questions or concerns.  [place] left her name on the board.  The birth parent will call RN/LC for assistance with breastfeeding.   Maternal Data    Feeding Mother's Current Feeding Choice: Breast Milk and Formula Nipple Type: Extra Slow Flow  LATCH Score                    Lactation Tools Discussed/Used    Interventions Interventions: Breast feeding basics reviewed;Education  Discharge    Consult Status Consult Status: Follow-up Date: 05/29/22 Follow-up type: In-patient    [name] 05/28/2022, 2:31 PM

## 2022-05-28 NOTE — Lactation Note (Signed)
This note was copied from a baby's chart. Lactation Consultation Note  Patient Name: Dawn Little [id number] Date: 05/28/2022 Reason for consult: Mother's request;Difficult latch;Follow-up assessment;1st time breastfeeding;Term;Hyperbilirubinemia (on Billi lights) Age:35 hours Current feeding choice is:  breast feeding and supplementing with formula. [place] worked with Data processing manager on infant obtaining a Automotive engineer, Birth Parent latched infant on her right breast using the football hold position, infant latched with depth, sustained latch and BF for 15 minutes. Afterwards Infant was given 2 mls of colostrum that Birth Parent hand expressed and 10 mls of formula using a purple and clear slow flow bottle nipple and infant was on billi blanket during the feeding , then placed  back under the bank light.  Birth Parent will continue to BF infant according to hunger cues, on demand, 8 to 12+ times within 24 hours , STS.  After latching at the breast infant will be supplemented with any EBM first and then formula, based on infant's age the supplemental amount on Day 2 is 7-12 mls per feeding.  Birth Parent was still using DEBP when [place] left the room, Birth Parent knows to pump every 3 hours for 15 minutes on initial setting.  Birth Parent knows to call [place] if their are any BF questions, concerns of if further latch assistance is needed. Maternal Data Has patient been taught Hand Expression?: Yes  Feeding Mother's Current Feeding Choice: Breast Milk and Formula  LATCH Score Latch: Grasps breast easily, tongue down, lips flanged, rhythmical sucking.  Audible Swallowing: A few with stimulation  Type of Nipple: Everted at rest and after stimulation  Comfort (Breast/Nipple): Soft / non-tender  Hold (Positioning): Assistance needed to correctly position infant at breast and maintain latch.  LATCH Score: 8   Lactation Tools Discussed/Used    Interventions Interventions: Assisted with  latch;Skin to skin;Breast compression;Adjust position;Support pillows;Position options;Expressed milk;DEBP;Education;Pace feeding  Discharge    Consult Status Consult Status: Follow-up Date: 05/28/22 Follow-up type: In-patient    Vicente Serene 05/28/2022, 2:47 AM

## 2022-05-29 MED ORDER — FUROSEMIDE 20 MG PO TABS
20.0000 mg | ORAL_TABLET | Freq: Every day | ORAL | Status: DC
  Administered 2022-05-29 – 2022-05-30 (×2): 20 mg via ORAL
  Filled 2022-05-29 (×2): qty 1

## 2022-05-29 NOTE — Lactation Note (Signed)
This note was copied from a baby's chart. Lactation Consultation Note  Patient Name: Dawn Little [id number] Date: 05/29/2022 Reason for consult: Follow-up assessment;Mother's request;Difficult latch;Primapara;1st time breastfeeding;Term;Breastfeeding assistance Age:35 hours  Interpreter [phone] [place] Blas #[id number] Used  [facility] entered the room and the birth parent was sitting in a chair.  Per a support person (the infant's aunt), the birth parent just finished feeding the infant and he was not getting any milk.  The supporting parent stated that he was concerned that the birth parent had a clogged milk duct.  LC assessed the breast tissue and asked the birth parent if she was experiencing any discomfort.  No lumps or redness were noted in the breast tissue.  The breast tissue was soft.  LC spoke with the parents about milk production, supply, and demand, and stimulation.  The birth parent was encouraged to breastfeed with feeding cues and pump after feedings.  The birth parent was also encouraged to follow-up with outpatient lactation services.  All questions were answered.    Current Feeding Plan:  Breastfeed with feeding cues 8+ times in 24 hours.  Supplement according to supplementation guidelines.  Pump after feedings for stimulation.  Watch infant output and call the pediatrician with questions or concerns.   Call the outpatient [facility] for assistance with breastfeeding.    Feeding Mother's Current Feeding Choice: Breast Milk and Formula     Interventions Interventions: Breast feeding basics reviewed;Education  Discharge    Consult Status Consult Status: Complete Date: 05/29/22 Follow-up type: Call as needed    [name] 05/29/2022, 12:57 PM

## 2022-05-29 NOTE — Lactation Note (Signed)
This note was copied from a baby's chart. Lactation Consultation Note  Patient Name: Dawn Little [id number] Date: 05/29/2022 Reason for consult: Follow-up assessment;Hyperbilirubinemia;Primapara;1st time breastfeeding;Breastfeeding assistance;Term Age:35 hours  LC entered the room and the birth parent was holding the infant while he was on the bili blanket.  Per the birth parent, she has been stimulating her breasts with the pump, but she has not had an increase in milk volume.  LC spoke with the parents about milk production, pumping frequency, milk storage guidelines, and outpatient lactation services.  LC also provided education about engorgement, infant I/O, warning signs, and breast care.  All questions were answered.  The parents stated that they had no further questions.  The birth parent will call RN/LC for assistance with breastfeeding.   Current Feeding Plan:  Breastfeed 8+ times in 24 hours according to feeding cues.  Put the infant to the breast before supplementing.  Supplement according to supplementation guidelines.  Pump for stimulation after feedings.  When the milk volume increases, speak with outpatient LC about pumping frequency.  Watch infant I/O and call the pediatrician with questions or concerns.    Feeding Mother's Current Feeding Choice: Breast Milk and Formula  Interventions Interventions: Breast feeding basics reviewed;Education  Discharge Discharge Education: Engorgement and breast care;Warning signs for feeding baby;Outpatient recommendation Pump: DEBP;Personal (The birth parent is receiving a pump when she returns home from a family member.)  Consult Status Consult Status: Follow-up Date: 05/30/22 Follow-up type: In-patient    Dawn Little 05/29/2022, 9:47 AM

## 2022-05-29 NOTE — Progress Notes (Signed)
POSTPARTUM PROGRESS NOTE  POD #2  Subjective:  Dawn Little is a 36 y.o. G1P1001 s/p pLTCS at [redacted]w[redacted]d. No acute events overnight. She reports she is doing well. She denies any problems with ambulating, voiding or po intake. Denies nausea or vomiting. She has passed flatus. Pain is well controlled.  Lochia is adequate.  Objective: Blood pressure 119/69, pulse 76, temperature 98 F (36.7 C), temperature source Oral, resp. rate 16, height 5' (1.524 m), weight 98.4 kg, last menstrual period 09/03/2021, SpO2 99 %, unknown if currently breastfeeding.  Physical Exam:  General: alert, cooperative and no distress Chest: no respiratory distress Heart: regular rate, distal pulses intact Uterine Fundus: firm, appropriately tender DVT Evaluation: No calf swelling or tenderness Extremities: + edema Skin: warm, dry; incision clean/dry/intact w/ honeycomb dressing in place  Recent Labs    05/28/22 0513  HGB 8.9*  HCT 26.7*    Assessment/Plan: Dawn Little is a 36 y.o. G1P1001 s/p pLTCS at [redacted]w[redacted]d for AOD.   POD#1 - Doing welll; pain well controlled. H/H appropriate             Routine postpartum care             OOB, ambulated             Lovenox for VTE prophylaxis  Add lasix QD x 5 d Anemia: asymptomatic, start ferrous sulfate qod Contraception: Decline Feeding: Breast   Dispo: Plan for discharge 10/23.   LOS: 3 days   Shelda Pal, DO OB Fellow  05/29/2022, 3:39 PM

## 2022-05-30 LAB — SURGICAL PATHOLOGY

## 2022-05-30 MED ORDER — ACETAMINOPHEN 325 MG PO TABS: 650.0000 mg | ORAL_TABLET | Freq: Four times a day (QID) | ORAL | Status: AC | PRN

## 2022-05-30 MED ORDER — OXYCODONE HCL 5 MG PO TABS: 5.0000 mg | ORAL_TABLET | Freq: Four times a day (QID) | ORAL | 0 refills | Status: AC | PRN

## 2022-05-30 MED ORDER — IBUPROFEN 600 MG PO TABS: 600.0000 mg | ORAL_TABLET | Freq: Four times a day (QID) | ORAL | 0 refills | Status: AC

## 2022-05-30 MED ORDER — FERROUS SULFATE 325 (65 FE) MG PO TABS: 325.0000 mg | ORAL_TABLET | ORAL | 0 refills | Status: AC

## 2022-05-30 MED ORDER — DOCUSATE SODIUM 100 MG PO CAPS: 100.0000 mg | ORAL_CAPSULE | Freq: Two times a day (BID) | ORAL | 0 refills | Status: AC | PRN

## 2022-05-30 MED ORDER — FUROSEMIDE 20 MG PO TABS: 20.0000 mg | ORAL_TABLET | Freq: Every day | ORAL | 0 refills | Status: DC

## 2022-05-30 NOTE — Lactation Note (Signed)
This note was copied from a baby's chart. Lactation Consultation Note  Patient Name: Dawn Little [id number] Date: 05/30/2022 Reason for consult: Follow-up assessment;Primapara;1st time breastfeeding;Term (Lab into draw blood and dad requesting to talk to [facility] , waiting. LC will F/U after Labs) Age:36 years  Maternal Data    Feeding Mother's Current Feeding Choice: Breast Milk and Formula  LATCH Score   Lactation Tools Discussed/Used    Interventions    Discharge    Consult Status Consult Status: Follow-up Date: 05/30/22 Follow-up type: In-patient    [place] 05/30/2022, 11:58 AM

## 2022-06-02 ENCOUNTER — Other Ambulatory Visit: Payer: Self-pay

## 2022-06-09 ENCOUNTER — Telehealth (HOSPITAL_COMMUNITY): Payer: Self-pay | Admitting: *Deleted

## 2022-06-09 NOTE — Telephone Encounter (Addendum)
Voicemail not setup. Unable to leave message.  Odis Hollingshead, RN 06-09-2022 at 10:23am

## 2022-07-06 ENCOUNTER — Ambulatory Visit: Payer: Self-pay | Admitting: Medical

## 2022-07-06 ENCOUNTER — Inpatient Hospital Stay (HOSPITAL_COMMUNITY)
Admission: AD | Admit: 2022-07-06 | Discharge: 2022-07-06 | Disposition: A | Discharge status: Home / Self Care | Payer: Self-pay | Attending: Obstetrics and Gynecology | Admitting: Obstetrics and Gynecology

## 2022-07-06 DIAGNOSIS — O9089 Other complications of the puerperium, not elsewhere classified: Secondary | ICD-10-CM | POA: Insufficient documentation

## 2022-07-06 DIAGNOSIS — Z98891 History of uterine scar from previous surgery: Secondary | ICD-10-CM

## 2022-07-06 DIAGNOSIS — L7682 Other postprocedural complications of skin and subcutaneous tissue: Secondary | ICD-10-CM

## 2022-07-06 NOTE — MAU Note (Signed)
...  Dawn Little is a 36 y.o. at Unknown here in MAU reporting: Patient is day 5 weeks 5 days post partum C/S that occurred on 05/27/2022. She reports there is a portion of her incision that is yellow and she is unsure if it is the glue so she would like to be evaluated. She reports when she was discharged she was instructed to shower and not use any soaps. She denies any odors. She is also reporting that she has felt a sharp pain when she sits down on the left side of her incision.   Onset of complaint: Today - yellow spot on incision.  Pain score: Denies pain.

## 2022-07-06 NOTE — MAU Provider Note (Signed)
MAU Provider Note  History  [id number]  Arrival date and time: 07/06/22 1651   Chief Complaint  Patient presents with   Wound Check   HPI Dawn Little is a 36 y.o. G1P1001 at 5 weeks and 5 days post c-section, who presents for wound check. She states her left side of her incision feels like there is "pulling" and has pain when moving. Clear fluid drainage, no [place] or blood. She is worried that her wound has not healed yet and she would like to be evaluated. Vaginal bleeding: Yes, light like a period LOF: No Contractions: No  --/--/O POS (10/19 0543)  OB History     Gravida  1   Para  1   Term  1   Preterm  0   AB  0   Living  1      SAB  0   IAB  0   Ectopic  0   Multiple  0   Live Births  1           Past Medical History:  Diagnosis Date   Medical history non-contributory     Past Surgical History:  Procedure Laterality Date   CESAREAN SECTION N/A 05/27/2022   Procedure: CESAREAN SECTION;  Surgeon: Tereso Newcomer, MD;  Location: [facility];  Service: Obstetrics;  Laterality: N/A;   NO PAST SURGERIES      Family History  Problem Relation Age of Onset   Asthma Neg Hx    Cancer Neg Hx    Diabetes Neg Hx    Heart disease Neg Hx    Hypertension Neg Hx     Social History   Socioeconomic History   Marital status: Unknown    Spouse name: Not on file   Number of children: Not on file   Years of education: Not on file   Highest education level: Some college, no degree  Occupational History   Not on file  Tobacco Use   Smoking status: Never    Passive exposure: Never   Smokeless tobacco: Never  Vaping Use   Vaping Use: Never used  Substance and Sexual Activity   Alcohol use: Not Currently    Comment: not while preg   Drug use: Never   Sexual activity: Yes    Birth control/protection: None  Other Topics Concern   Not on file  Social History Narrative   Not on file   Social Determinants of Health   Financial  Resource Strain: Low Risk  (04/04/2022)   Overall Financial Resource Strain (CARDIA)    Difficulty of Paying Living Expenses: Not very hard  Food Insecurity: Food Insecurity Present (05/27/2022)   Hunger Vital Sign    Worried About Running Out of Food in the Last Year: Sometimes true    Ran Out of Food in the Last Year: Sometimes true  Transportation Needs: No Transportation Needs (05/27/2022)   PRAPARE - Administrator, Civil Service (Medical): No    Lack of Transportation (Non-Medical): No  Physical Activity: Insufficiently Active (04/04/2022)   Exercise Vital Sign    Days of Exercise per Week: 3 days    Minutes of Exercise per Session: 20 min  Stress: Not on file  Social Connections: Moderately Integrated (04/04/2022)   Social Connection and Isolation Panel [NHANES]    Frequency of Communication with Friends and Family: More than three times a week    Frequency of Social Gatherings with Friends and Family: Three times a week  Attends Religious Services: 1 to 4 times per year    Active Member of Clubs or Organizations: No    Attends Banker Meetings: Not on file    Marital Status: Living with partner  Intimate Partner Violence: Not At Risk (05/27/2022)   Humiliation, Afraid, Rape, and Kick questionnaire    Fear of Current or Ex-Partner: No    Emotionally Abused: No    Physically Abused: No    Sexually Abused: No    No Known Allergies  No current facility-administered medications on file prior to encounter.   Current Outpatient Medications on File Prior to Encounter  Medication Sig Dispense Refill   acetaminophen (TYLENOL) 325 MG tablet Take 2 tablets (650 mg total) by mouth every 6 (six) hours as needed.     ferrous sulfate 325 (65 FE) MG tablet Take 1 tablet (325 mg total) by mouth every other day. 15 tablet 0   ibuprofen (ADVIL) 600 MG tablet Take 1 tablet (600 mg total) by mouth every 6 (six) hours. 30 tablet 0   Prenatal Vit-Fe Fumarate-FA  (MULTIVITAMIN-PRENATAL) 27-0.8 MG TABS tablet Take 1 tablet by mouth daily at 12 noon.       Review of Systems  Constitutional:  Negative for appetite change, chills and fever.  HENT:  Negative for congestion, facial swelling and postnasal drip.   Eyes:  Negative for photophobia.  Respiratory:  Negative for cough and shortness of breath.   Cardiovascular:  Negative for chest pain.  Gastrointestinal:  Negative for abdominal pain, nausea and vomiting.       Incisional pain  Endocrine: Negative for polyuria.  Genitourinary:  Negative for flank pain and pelvic pain.  Musculoskeletal:  Negative for arthralgias.  Skin:  Negative for rash.  Neurological:  Negative for weakness and light-headedness.  Psychiatric/Behavioral:  Negative for confusion.     Pertinent positives and negative per HPI, all others reviewed and negative  Physical Exam   BP 111/80 (BP Location: Right Arm)   Pulse 81   Temp 98.2 F (36.8 C) (Oral)   Resp 15   Ht 5' (1.524 m)   Wt 87 kg   SpO2 100%   Breastfeeding Yes   BMI 37.44 kg/m   Patient Vitals for the past 24 hrs:  BP Temp Temp src Pulse Resp SpO2 Height Weight  07/06/22 1709 111/80 98.2 F (36.8 C) Oral 81 15 100 % 5' (1.524 m) 87 kg    Physical Exam Vitals reviewed.  Constitutional:      Appearance: Normal appearance.  HENT:     Head: Normocephalic and atraumatic.     Right Ear: External ear normal.     Left Ear: External ear normal.  Cardiovascular:     Rate and Rhythm: Normal rate and regular rhythm.  Pulmonary:     Effort: Pulmonary effort is normal.     Breath sounds: Normal breath sounds.  Abdominal:     General: Abdomen is flat.     Palpations: Abdomen is soft.     Comments: 2-3 cm opened incision, 1-43mm of depth. Granulation tissue noted, serous fluid leakage, no pus. No bleeding, no ttp.   Skin:    General: Skin is warm.     Capillary Refill: Capillary refill takes less than 2 seconds.  Neurological:     Mental Status: She is  oriented to person, place, and time.  Psychiatric:        Mood and Affect: Mood normal.     Cervical Exam  N/a  Bedside Ultrasound not done  FHT N/a  Labs No results found for this or any previous visit (from the past 24 hour(s)).  Imaging No results found.  MAU Course  Procedures Lab Orders  No laboratory test(s) ordered today   No orders of the defined types were placed in this encounter.  Imaging Orders  No imaging studies ordered today    MDM mild  Assessment and Plan  Incision pain s/p c/s 5 weeks and 5d postpartum  Pt seen for nonhealing incisional wound pain. 2-3 cm opened incision, 1-32mm of depth. Granulation tissue noted, serous fluid leakage, no pus. No bleeding, no ttp. I suspect no infection of dehiscence.  Her wound was irrigated with low pressure and covered with gauze. She was given some materials to clean at home. She will need a visit to get her incision checked in the office. Message sent to [facility].  Gave return precautions.  Follow-up with primary OB.  Dispo: discharged to home in stable condition.   Discharge Instructions     Discharge patient   Complete by: As directed    Discharge disposition: 01-Home or Self Care   Discharge patient date: 07/06/2022      Allergies as of 07/06/2022   No Known Allergies      Medication List     STOP taking these medications    furosemide 20 MG tablet Commonly known as: LASIX       TAKE these medications    acetaminophen 325 MG tablet Commonly known as: Tylenol Take 2 tablets (650 mg total) by mouth every 6 (six) hours as needed.   ferrous sulfate 325 (65 FE) MG tablet Take 1 tablet (325 mg total) by mouth every other day.   ibuprofen 600 MG tablet Commonly known as: ADVIL Take 1 tablet (600 mg total) by mouth every 6 (six) hours.   multivitamin-prenatal 27-0.8 MG Tabs tablet Take 1 tablet by mouth daily at 12 noon.        Myrtie Hawk, DO FMOB Fellow, Faculty  practice [facility], [facility] 07/06/22  6:09 PM

## 2022-07-07 ENCOUNTER — Telehealth: Payer: Self-pay | Admitting: Family Medicine

## 2022-07-07 NOTE — Telephone Encounter (Signed)
Called patient to setup incision check appt but was unable to reach, left voicemail. Will try back again this afternoon

## 2022-07-08 ENCOUNTER — Telehealth: Payer: Self-pay | Admitting: Family Medicine

## 2022-07-08 NOTE — Telephone Encounter (Signed)
Tried to contact patient yesterday afternoon and this morning but there was no answer, Left voicemail to call back.

## 2023-10-22 IMAGING — DX DG ANKLE COMPLETE 3+V*L*
3 series · 3 of 3 positions shown · non-contrast
Comparison: None.

CLINICAL DATA: Trauma to the left ankle.

EXAM:
LEFT ANKLE COMPLETE - 3+ VIEW

[ankle ap]
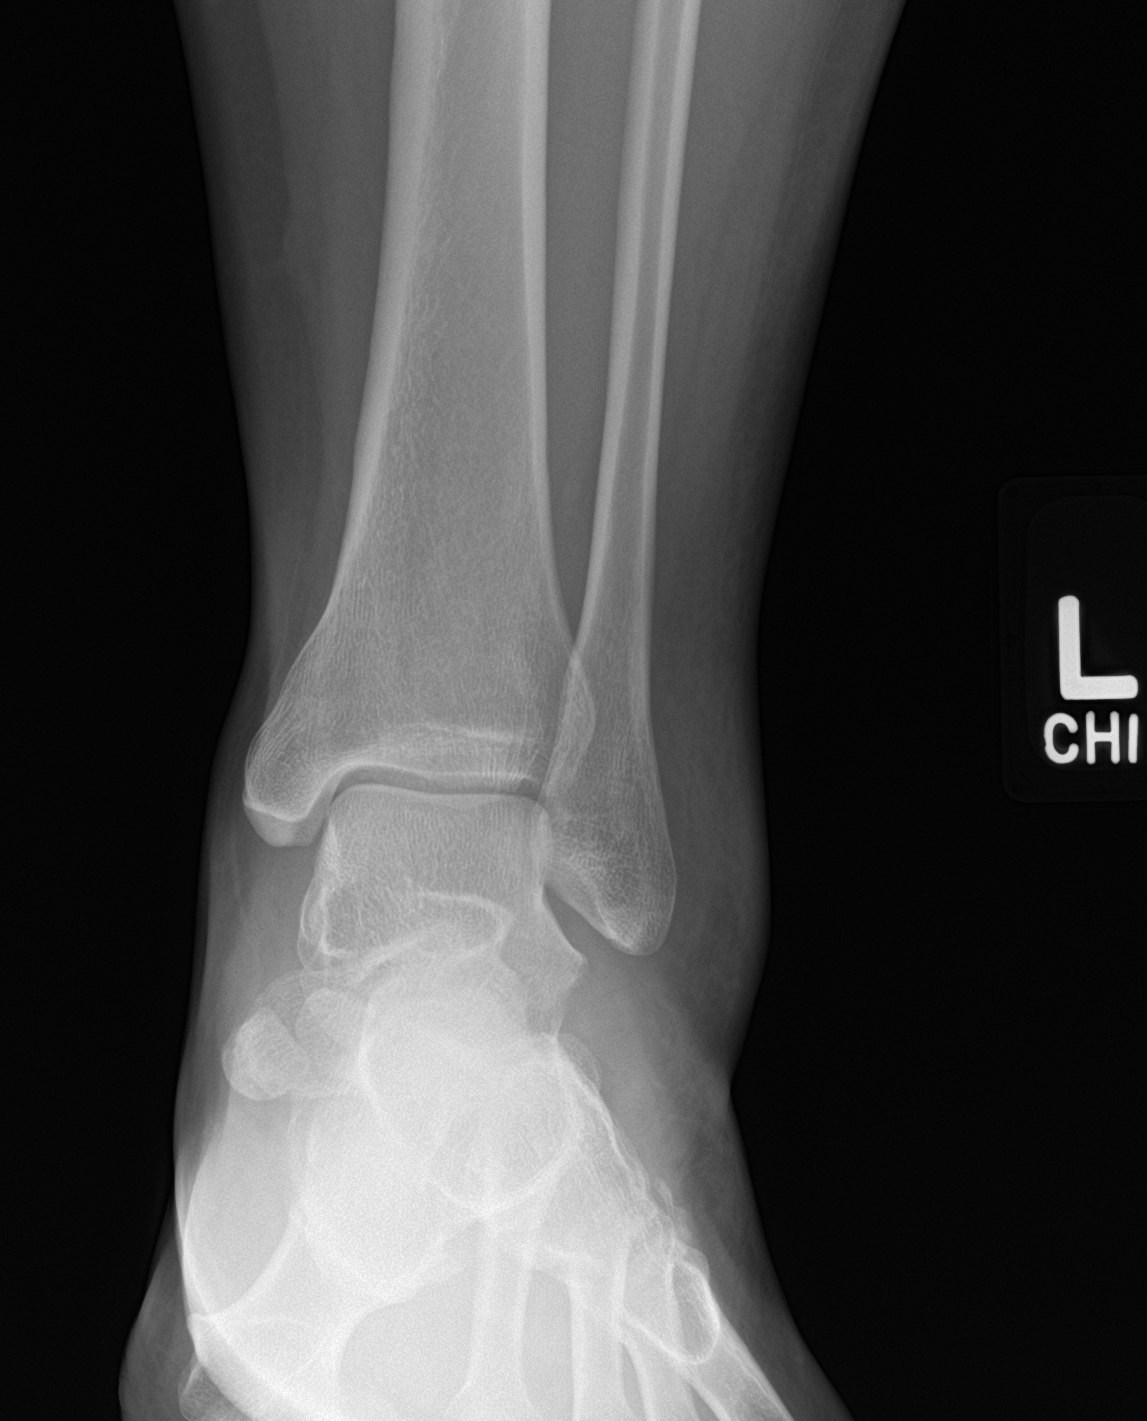

[ankle obl]
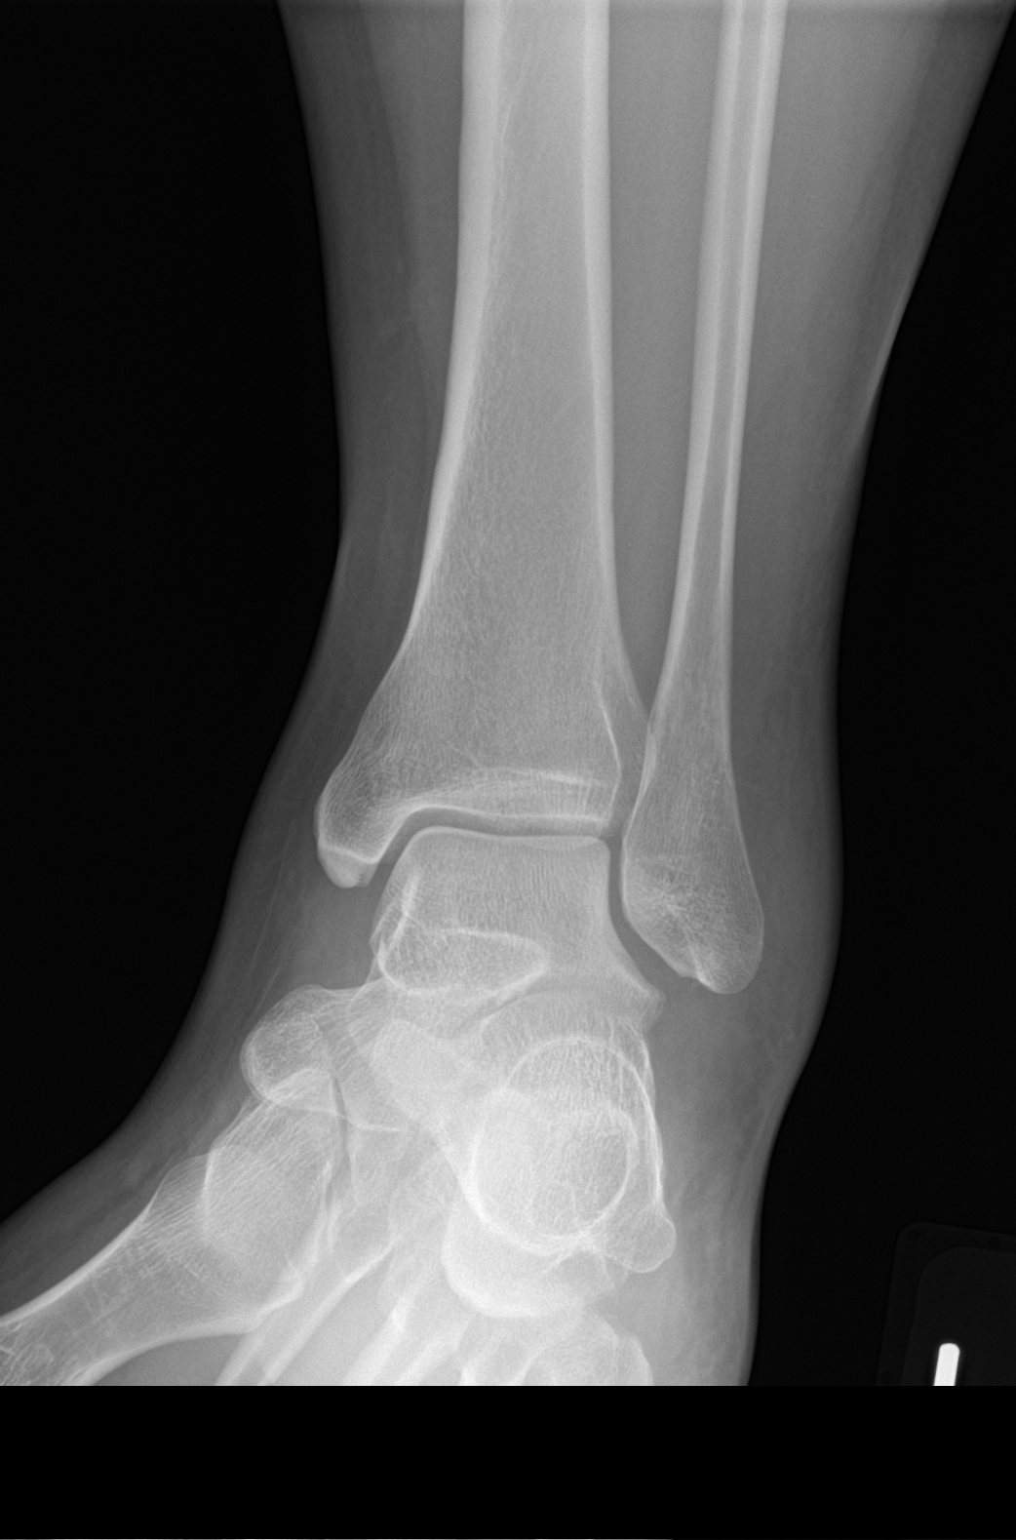

[ankle lat]
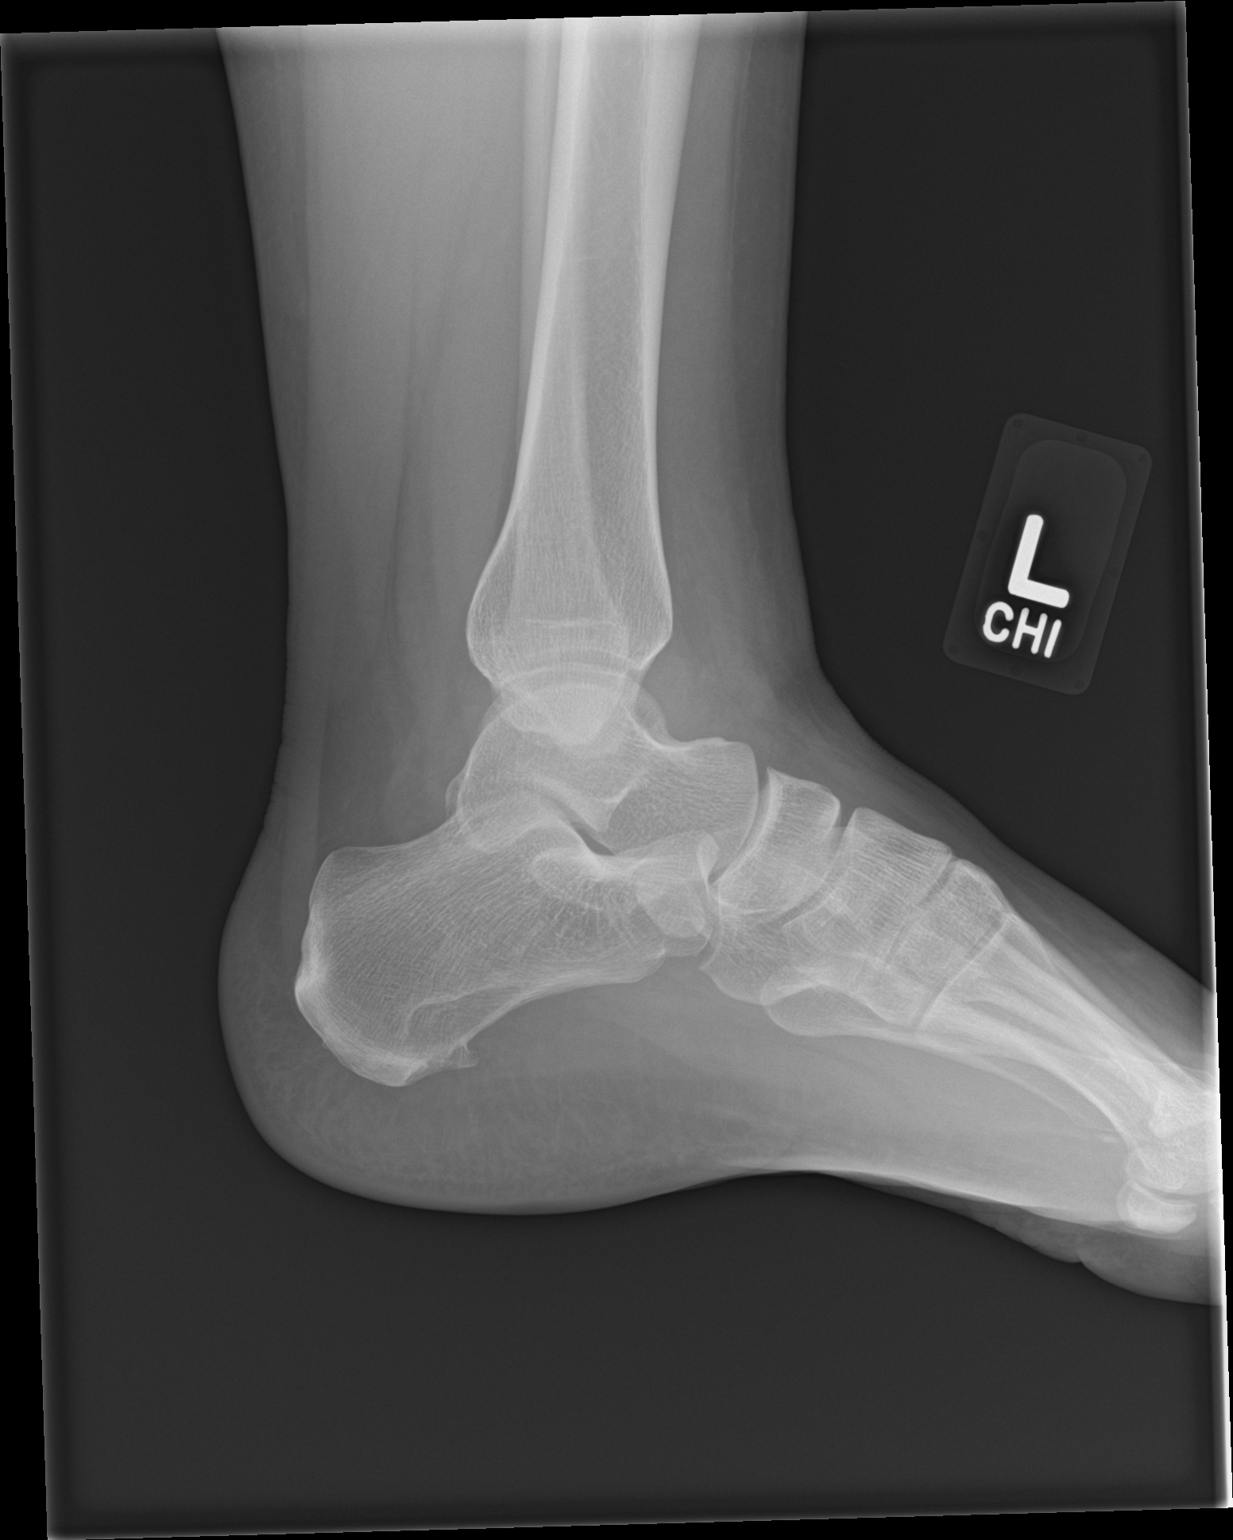

[3 of 3 positions shown; findings below may reference images not displayed]

FINDINGS: Apparent bone fragment along the lateral foot may represent cortical
avulsion injury. This is not well evaluated on this radiograph.
Dedicated radiograph of the foot is recommended for better
evaluation. No other acute fracture identified. No dislocation. The
bones are well mineralized. No arthritic changes. The soft tissue
swelling of the lateral ankle.
IMPRESSION: 1. Possible cortical avulsion fracture of the lateral foot.
Dedicated radiograph of the foot is recommended for better
evaluation.
2. Soft tissue swelling of the lateral ankle.

## 2023-12-23 IMAGING — US US OB COMP LESS 14 WK
1 series · 15 of 21 positions shown · non-contrast
Comparison: None

CLINICAL DATA: Uncertain last menstrual cycle, assess viability.
Last reported menstrual period 09/03/2021, gestational age by LMP of
8 weeks 4 days.

EXAM:
OBSTETRIC <14 WK ULTRASOUND
TECHNIQUE: Transabdominal ultrasound was performed for evaluation of the
gestation as well as the maternal uterus and adnexal regions.

[Series 1: us ob comp less 14 wk · 21 acquisitions, 15 frames shown]
[im 1/21]
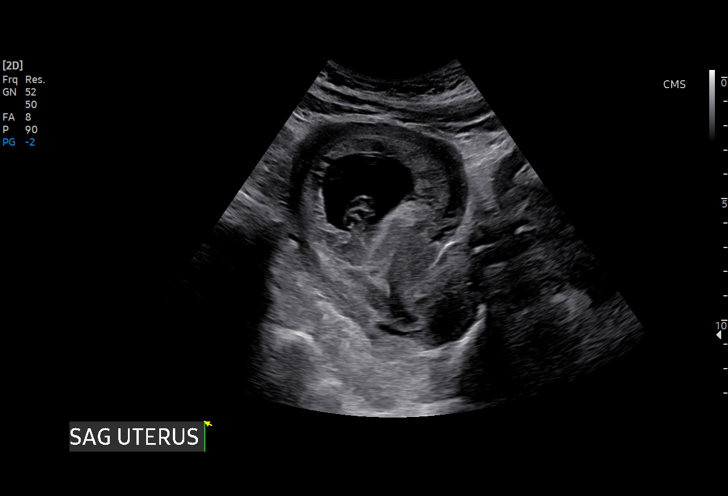
[im 3/21]
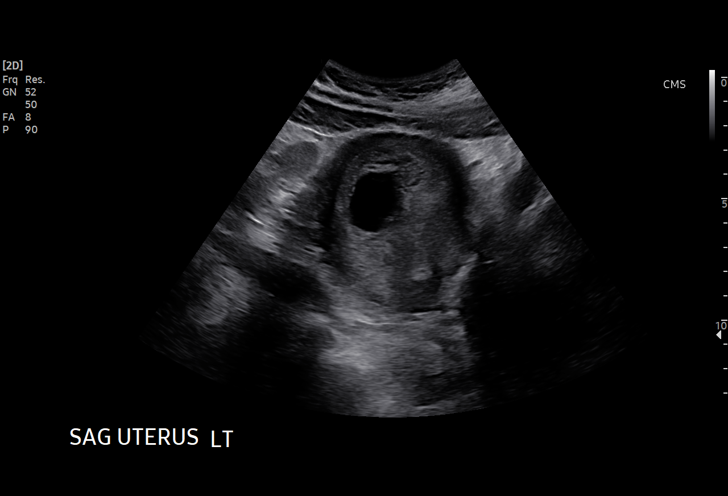
[im 4/21]
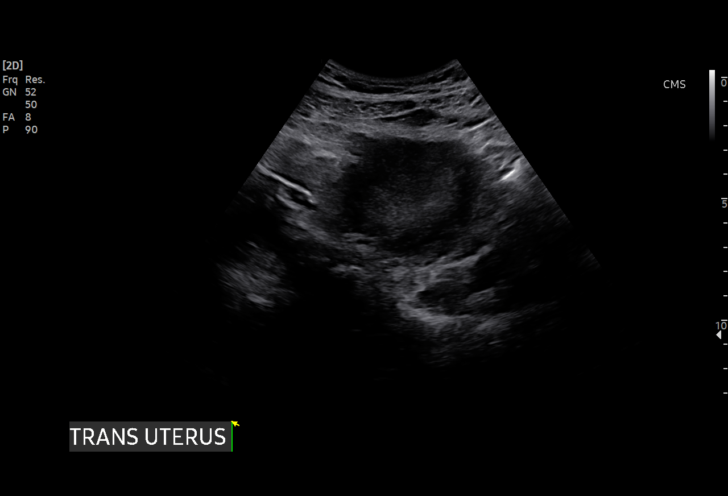
[im 5/21]
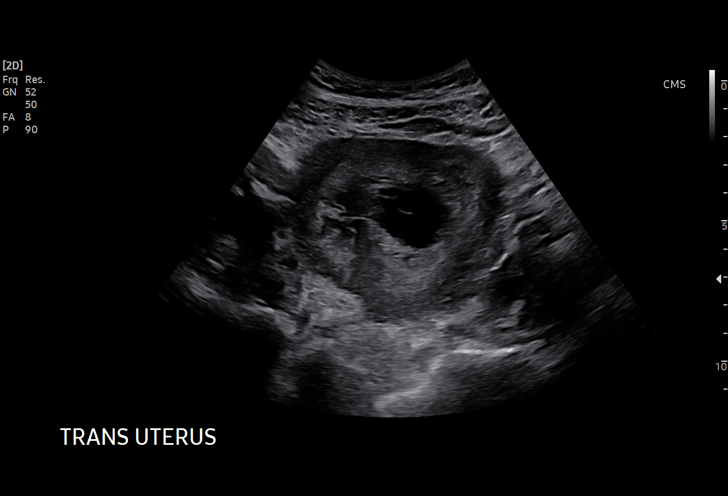
[im 7/21]
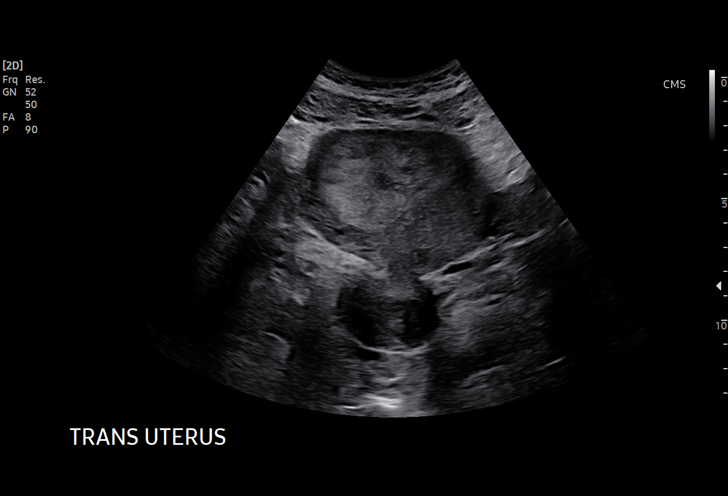
[im 8/21]
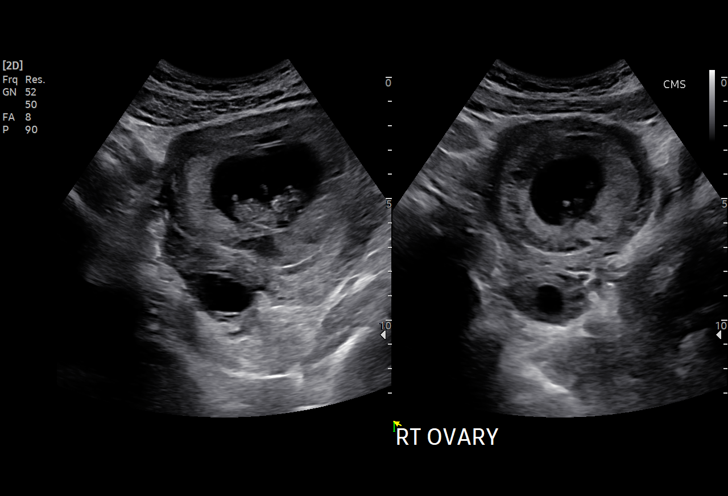
[im 10/21]
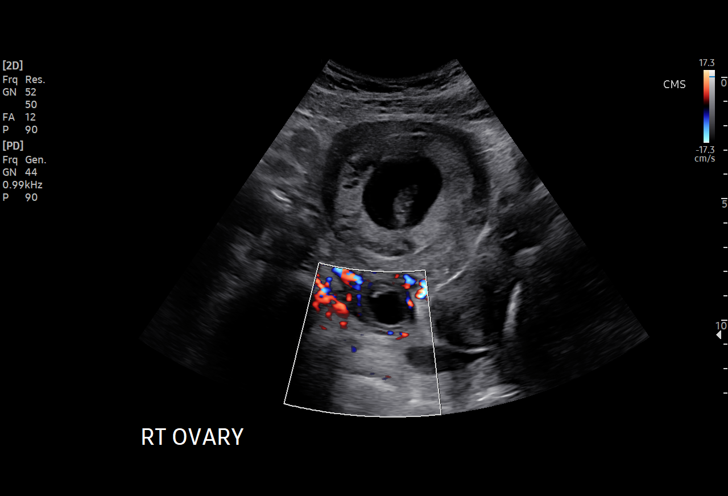
[im 11/21]
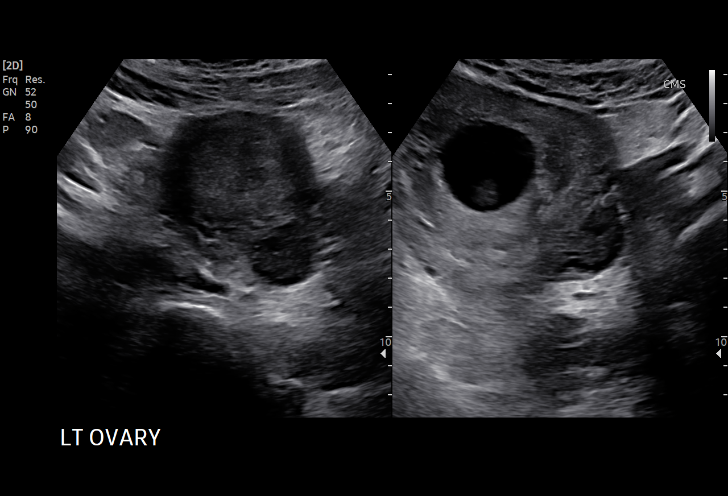
[im 12/21]
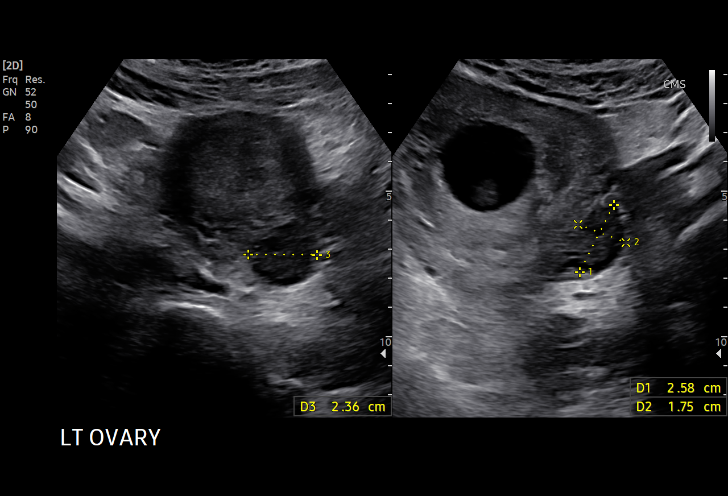
[im 14/21]
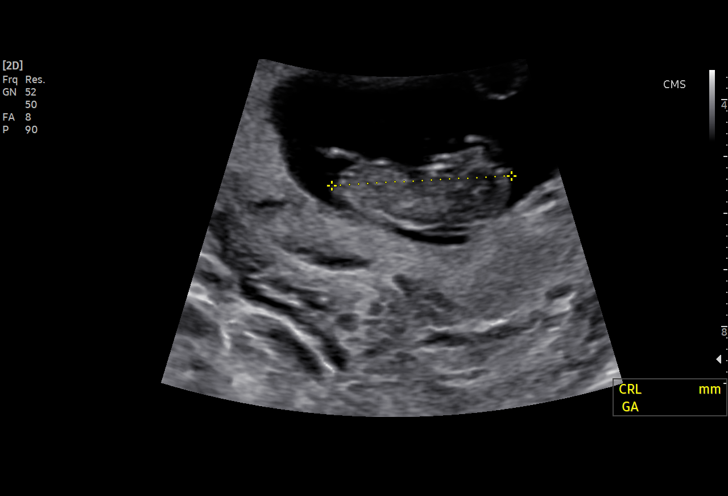
[im 15/21]
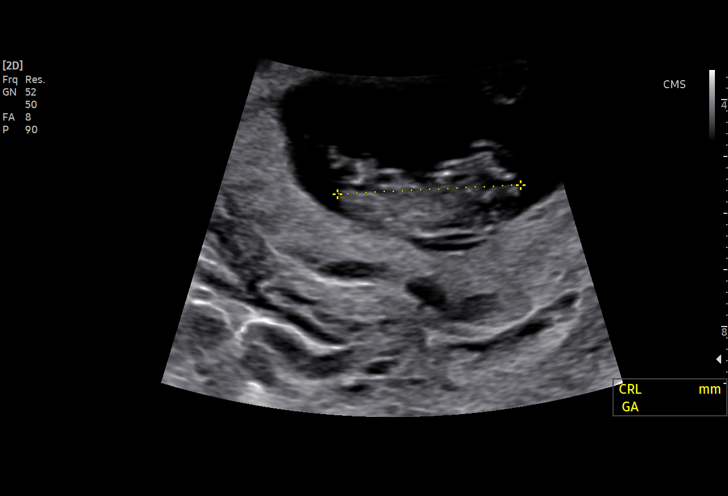
[im 17/21]
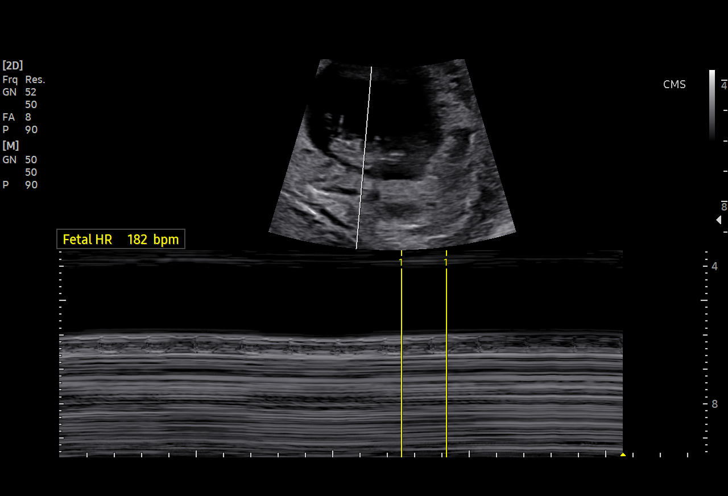
[im 18/21]
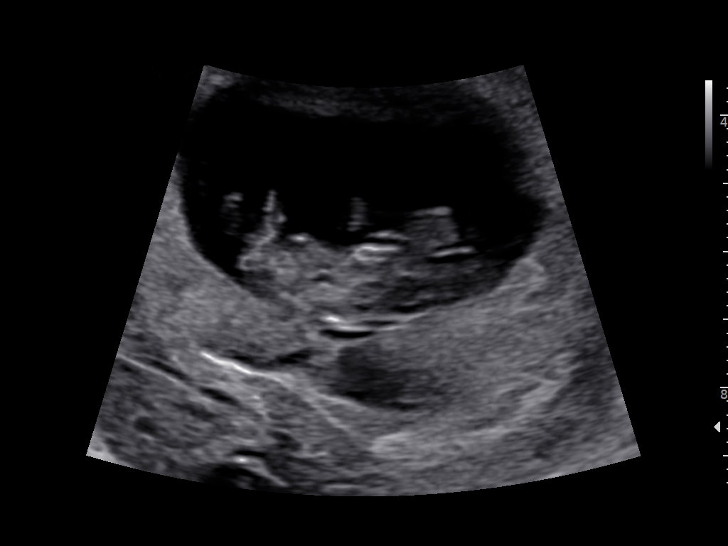
[im 19/21]
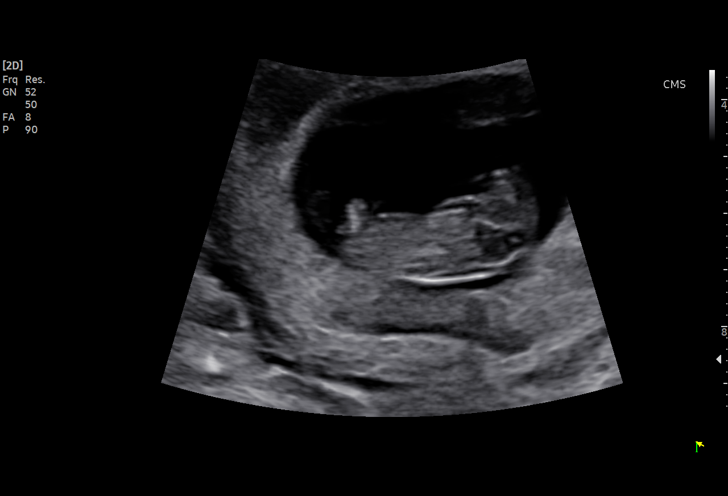
[im 21/21]
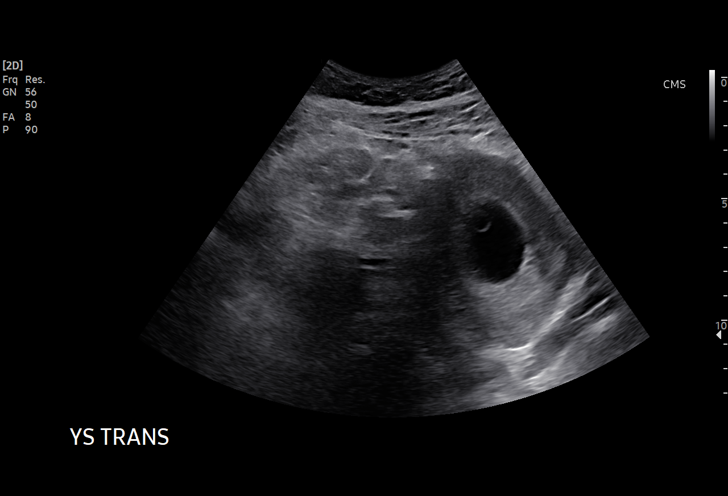

[15 of 21 positions shown; findings below may reference images not displayed]

FINDINGS: Intrauterine gestational sac: Single

Yolk sac:  Visualized

Embryo:  Visualized

Cardiac Activity: Visualized

Heart Rate: 182 bpm

CRL:   32.9 mm   10 w 1 d                  US EDC: 05/30/2022

Subchorionic hemorrhage:  None visualized.

Maternal uterus/adnexae: Small subchorionic hemorrhage along the
posterior margin of the gestational sac.

Ovaries are unremarkable with presumed corpus luteum on the RIGHT.

No free fluid.
IMPRESSION: Single viable intrauterine pregnancy at 10 weeks 1 day by crown-rump
length with signs of mild fetal tachycardia, heart rate of 182, of
uncertain significance, potentially transitory but warrants close
follow-up with OB.

Small subchorionic hemorrhage.
